# Patient Record
Sex: Female | Born: 1970 | Race: Black or African American | Hispanic: No | Marital: Married | Smoking: Never smoker
Health system: Southern US, Community
[De-identification: ages and names within clinical notes are randomized; demographics above are authoritative.]

## PROBLEM LIST (undated history)

## (undated) DIAGNOSIS — Z9981 Dependence on supplemental oxygen: Secondary | ICD-10-CM

## (undated) DIAGNOSIS — I2699 Other pulmonary embolism without acute cor pulmonale: Secondary | ICD-10-CM

## (undated) DIAGNOSIS — G629 Polyneuropathy, unspecified: Secondary | ICD-10-CM

## (undated) DIAGNOSIS — F419 Anxiety disorder, unspecified: Secondary | ICD-10-CM

## (undated) DIAGNOSIS — D649 Anemia, unspecified: Secondary | ICD-10-CM

## (undated) DIAGNOSIS — M545 Low back pain, unspecified: Secondary | ICD-10-CM

## (undated) DIAGNOSIS — E039 Hypothyroidism, unspecified: Secondary | ICD-10-CM

## (undated) DIAGNOSIS — Z992 Dependence on renal dialysis: Secondary | ICD-10-CM

## (undated) DIAGNOSIS — N186 End stage renal disease: Secondary | ICD-10-CM

## (undated) DIAGNOSIS — M1990 Unspecified osteoarthritis, unspecified site: Secondary | ICD-10-CM

## (undated) DIAGNOSIS — Z9289 Personal history of other medical treatment: Secondary | ICD-10-CM

## (undated) DIAGNOSIS — F32A Depression, unspecified: Secondary | ICD-10-CM

## (undated) DIAGNOSIS — I499 Cardiac arrhythmia, unspecified: Secondary | ICD-10-CM

## (undated) DIAGNOSIS — K5900 Constipation, unspecified: Secondary | ICD-10-CM

## (undated) DIAGNOSIS — G8929 Other chronic pain: Secondary | ICD-10-CM

## (undated) DIAGNOSIS — G2581 Restless legs syndrome: Secondary | ICD-10-CM

## (undated) DIAGNOSIS — E119 Type 2 diabetes mellitus without complications: Secondary | ICD-10-CM

## (undated) DIAGNOSIS — G4733 Obstructive sleep apnea (adult) (pediatric): Secondary | ICD-10-CM

## (undated) DIAGNOSIS — F329 Major depressive disorder, single episode, unspecified: Secondary | ICD-10-CM

## (undated) HISTORY — PX: DILATION AND CURETTAGE OF UTERUS: SHX78

## (undated) HISTORY — PX: ARTERIOVENOUS GRAFT PLACEMENT: SUR1029

## (undated) HISTORY — DX: Morbid (severe) obesity due to excess calories: E66.01

## (undated) HISTORY — PX: ESOPHAGOGASTRODUODENOSCOPY: SHX1529

## (undated) HISTORY — PX: THROMBECTOMY / ARTERIOVENOUS GRAFT REVISION: SUR1351

## (undated) HISTORY — PX: DEBRIDMENT OF DECUBITUS ULCER: SHX6276

## (undated) HISTORY — PX: INCISION AND DRAINAGE OF WOUND: SHX1803

---

## 2010-12-04 DIAGNOSIS — Z9289 Personal history of other medical treatment: Secondary | ICD-10-CM

## 2010-12-04 HISTORY — PX: BELOW KNEE LEG AMPUTATION: SUR23

## 2010-12-04 HISTORY — DX: Personal history of other medical treatment: Z92.89

## 2015-12-05 DIAGNOSIS — I2699 Other pulmonary embolism without acute cor pulmonale: Secondary | ICD-10-CM

## 2015-12-05 HISTORY — DX: Other pulmonary embolism without acute cor pulmonale: I26.99

## 2016-03-27 ENCOUNTER — Other Ambulatory Visit: Payer: Self-pay | Admitting: *Deleted

## 2016-03-27 DIAGNOSIS — N186 End stage renal disease: Secondary | ICD-10-CM

## 2016-03-27 DIAGNOSIS — Z01810 Encounter for preprocedural cardiovascular examination: Secondary | ICD-10-CM

## 2016-03-28 ENCOUNTER — Encounter (HOSPITAL_COMMUNITY): Payer: Self-pay

## 2016-03-28 ENCOUNTER — Ambulatory Visit: Payer: Self-pay | Admitting: Vascular Surgery

## 2016-03-28 ENCOUNTER — Other Ambulatory Visit (HOSPITAL_COMMUNITY): Payer: Self-pay

## 2016-04-06 ENCOUNTER — Encounter: Payer: Self-pay | Admitting: Vascular Surgery

## 2016-04-11 ENCOUNTER — Ambulatory Visit (HOSPITAL_COMMUNITY): Payer: Medicare Other

## 2016-04-11 ENCOUNTER — Ambulatory Visit: Payer: Medicare Other | Admitting: Vascular Surgery

## 2016-04-12 ENCOUNTER — Encounter: Payer: Self-pay | Admitting: Vascular Surgery

## 2016-04-12 ENCOUNTER — Ambulatory Visit (INDEPENDENT_AMBULATORY_CARE_PROVIDER_SITE_OTHER)
Admission: RE | Admit: 2016-04-12 | Discharge: 2016-04-12 | Disposition: A | Discharge status: Home / Self Care | Payer: Medicare Other | Source: Ambulatory Visit | Attending: Vascular Surgery | Admitting: Vascular Surgery

## 2016-04-12 ENCOUNTER — Encounter (HOSPITAL_COMMUNITY): Payer: Medicare Other

## 2016-04-12 ENCOUNTER — Ambulatory Visit: Payer: Medicare Other | Admitting: Vascular Surgery

## 2016-04-12 ENCOUNTER — Other Ambulatory Visit (HOSPITAL_COMMUNITY): Payer: Medicare Other

## 2016-04-12 ENCOUNTER — Ambulatory Visit (HOSPITAL_COMMUNITY)
Admission: RE | Admit: 2016-04-12 | Discharge: 2016-04-12 | Disposition: A | Discharge status: Home / Self Care | Payer: Medicare Other | Source: Ambulatory Visit | Attending: Vascular Surgery | Admitting: Vascular Surgery

## 2016-04-12 ENCOUNTER — Ambulatory Visit (INDEPENDENT_AMBULATORY_CARE_PROVIDER_SITE_OTHER): Payer: Medicare Other | Admitting: Vascular Surgery

## 2016-04-12 VITALS — BP 93/61 | HR 93 | Ht 72.0 in | Wt 312.0 lb

## 2016-04-12 DIAGNOSIS — Z01810 Encounter for preprocedural cardiovascular examination: Secondary | ICD-10-CM

## 2016-04-12 DIAGNOSIS — N186 End stage renal disease: Secondary | ICD-10-CM

## 2016-04-12 NOTE — Progress Notes (Signed)
[facility] [place]  Patient name: Christina Walters MRN: [id number] DOB: 1971/08/10 Sex: female  REASON FOR CONSULT: Evaluate for new access. Further by Dr. Fidela Salisbury.  HPI: Christina Walters is a 45 y.o. female, who comes in for evaluation for new hemodialysis access. She has had forearm and upper arm grafts on both sides. Most recently, she had a left HeRo raft done in [facility] in March 2017. This has occluded. She did have a previous left thigh AV graft which had to be removed because of infection. She's never had a right thigh AV graft. 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 She dialyzes on Monday Wednesdays and Fridays.  She denies any recent uremic symptoms. Specifically she denies nausea, vomiting, fatigue, anorexia, or palpitations.  No past medical history on file.  Her past medical history is significant for end-stage renal disease. She has diabetes, previous history of hypertension, history of neuropathy, hypothyroidism, and morbid obesity.  No family history on file.  SOCIAL HISTORY: Social History   Social History  . Marital Status: Married    Spouse Name: N/A  . Number of Children: N/A  . Years of Education: N/A   Occupational History  . Not on file.   Social History Main Topics  . Smoking status: Never Smoker   . Smokeless tobacco: Never Used  . Alcohol Use: No  . Drug Use: No  . Sexual Activity: Not on file   Other Topics Concern  . Not on file   Social History Narrative  . No narrative on file    Allergies  Allergen Reactions  . Penicillins Hives and Itching  . Sulfa Antibiotics Hives and Itching  . Sulfamethoxazole-Trimethoprim Hives and Itching    Current Outpatient Prescriptions  Medication Sig Dispense Refill  . aspirin 81 MG tablet Take 81 mg by mouth daily.    . cinacalcet (SENSIPAR) 30 MG tablet Take by mouth.    . cyclobenzaprine  (FLEXERIL) 10 MG tablet Take by mouth.    Tery Sanfilippo Sodium 100 MG capsule Take by mouth.    . DULoxetine (CYMBALTA) 20 MG capsule Take by mouth.    . gabapentin (NEURONTIN) 600 MG tablet Take by mouth.    . hydrOXYzine (ATARAX/VISTARIL) 50 MG tablet     . levothyroxine (SYNTHROID, LEVOTHROID) 88 MCG tablet Take by mouth.    . midodrine (PROAMATINE) 5 MG tablet Take by mouth.    . mirtazapine (REMERON) 7.5 MG tablet Take by mouth.    . nystatin (NYSTATIN) powder Apply topically.    . ondansetron (ZOFRAN) 8 MG tablet Take by mouth.    . sevelamer carbonate (RENVELA) 800 MG tablet Take by mouth.     No current facility-administered medications for this visit.    REVIEW OF SYSTEMS:  [X]  denotes positive finding, [ ]  denotes negative finding Cardiac  Comments:  Chest pain or chest pressure:    Shortness of breath upon exertion:    Short of breath when lying flat:    Irregular heart rhythm:        Vascular    Pain in calf, thigh, or hip brought on by ambulation:    Pain in feet at night that wakes you up from your sleep:     Blood clot in your veins:    Leg swelling:         Pulmonary    Oxygen at home: X   Productive cough:  Wheezing:  X       Neurologic    Sudden weakness in arms or legs:     Sudden numbness in arms or legs:     Sudden onset of difficulty speaking or slurred speech:    Temporary loss of vision in one eye:     Problems with dizziness:         Gastrointestinal    Blood in stool:     Vomited blood:         Genitourinary    Burning when urinating:     Blood in urine:        Psychiatric    Major depression:  X       Hematologic    Bleeding problems:    Problems with blood clotting too easily:        Skin    Rashes or ulcers:        Constitutional    Fever or chills:      PHYSICAL EXAM: Filed Vitals:   04/12/16 1611  BP: 93/61  Pulse: 93  Height: 6' (1.829 m)  Weight: 312 lb (141.522 kg)  SpO2: 96%    GENERAL: The patient is a  well-nourished female, in no acute distress. The vital signs are documented above. CARDIAC: There is a regular rate and rhythm.  VASCULAR: she has a nonfunctioning graft in her right upper arm, right forearm, left upper arm, left forearm. I cannot palpate pedal pulses on the right although she does have a biphasic dorsalis pedis signal on the right. I cannot obtain a posterior tibial signal on the right. PULMONARY: There is good air exchange bilaterally without wheezing or rales. ABDOMEN: Soft and non-tender with normal pitched bowel sounds.  MUSCULOSKELETAL: There are no major deformities or cyanosis. NEUROLOGIC: No focal weakness or paresthesias are detected. SKIN: There are no ulcers or rashes noted. PSYCHIATRIC: The patient has a normal affect. She has a functioning right IJ tunneled dialysis catheter.  DATA:   UPPER EXTREMITY ARTERIAL DUPLEX: I have independently interpreted her upper extremity arterial duplex.  On the right side, there is a triphasic brachial signal, biphasic radial signal, and biphasic ulnar signal.  On the left side there is a triphasic brachial signal with a monophasic radial and ulnar signal.  UPPER EXTREMITY VEIN MAP: I have independently interpreted his upper extremity vein map area I do not see a usable cephalic vein or basilic vein in either arm.  MEDICAL ISSUES:  END-STAGE RENAL DISEASE: This patient has had upper arm grafts and forearm grafts in both arms. Her vein map today shows that there are no options for fistula and either arm. The incision in her upper arm and the right is fairly low and if the vein is patent in the axilla she could potentially have a new right upper arm graft. This reason I scheduled a venogram. If her venogram shows that she does not have a central venous occlusion on the right I would recommend placement of a new right upper arm graft. We need to be sure that her blood pressure is good as she has had some problems with hypotension  and I would be reluctant to place a graft if she is continuing to have problems with hypotension. If she has a central venous occlusion on the right then she could be considered for a right thigh graft although I think it would be helpful to have a preoperative ABI given that she's had a previous left below the knee amputation and  I cannot obtain a posterior tibial signal with the Doppler. She does have a biphasic dorsalis pedis signal. I'll make further recommendations pending the results of her venogram.   Waverly Ferrariickson, Rigo Letts [facility] [place] Beeper: [phone]

## 2016-04-13 ENCOUNTER — Encounter: Payer: Self-pay | Admitting: Nephrology

## 2016-04-13 ENCOUNTER — Other Ambulatory Visit: Payer: Self-pay

## 2016-04-24 ENCOUNTER — Encounter (HOSPITAL_COMMUNITY): Admission: RE | Payer: Self-pay | Source: Ambulatory Visit

## 2016-04-24 ENCOUNTER — Ambulatory Visit (HOSPITAL_COMMUNITY): Admission: RE | Admit: 2016-04-24 | Payer: Medicare Other | Source: Ambulatory Visit | Admitting: Vascular Surgery

## 2016-04-24 SURGERY — Surgical Case
Anesthesia: *Unknown

## 2016-04-24 SURGERY — A/V SHUNTOGRAM/FISTULAGRAM

## 2016-04-28 ENCOUNTER — Telehealth: Payer: Self-pay

## 2016-04-28 ENCOUNTER — Other Ambulatory Visit: Payer: Self-pay

## 2016-04-28 NOTE — Telephone Encounter (Signed)
Erroneous encounter

## 2016-05-09 ENCOUNTER — Encounter (HOSPITAL_BASED_OUTPATIENT_CLINIC_OR_DEPARTMENT_OTHER): Payer: Self-pay | Admitting: Emergency Medicine

## 2016-05-09 ENCOUNTER — Non-Acute Institutional Stay (HOSPITAL_BASED_OUTPATIENT_CLINIC_OR_DEPARTMENT_OTHER)
Admission: EM | Admit: 2016-05-09 | Discharge: 2016-05-10 | Disposition: A | Discharge status: Home / Self Care | Payer: Medicare Other | Attending: Emergency Medicine | Admitting: Emergency Medicine

## 2016-05-09 DIAGNOSIS — E875 Hyperkalemia: Secondary | ICD-10-CM | POA: Insufficient documentation

## 2016-05-09 DIAGNOSIS — Z79899 Other long term (current) drug therapy: Secondary | ICD-10-CM | POA: Diagnosis not present

## 2016-05-09 DIAGNOSIS — Z7982 Long term (current) use of aspirin: Secondary | ICD-10-CM | POA: Insufficient documentation

## 2016-05-09 DIAGNOSIS — Z9115 Patient's noncompliance with renal dialysis: Secondary | ICD-10-CM | POA: Insufficient documentation

## 2016-05-09 DIAGNOSIS — Z992 Dependence on renal dialysis: Secondary | ICD-10-CM | POA: Insufficient documentation

## 2016-05-09 DIAGNOSIS — J029 Acute pharyngitis, unspecified: Secondary | ICD-10-CM | POA: Diagnosis present

## 2016-05-09 DIAGNOSIS — E1165 Type 2 diabetes mellitus with hyperglycemia: Secondary | ICD-10-CM | POA: Diagnosis not present

## 2016-05-09 DIAGNOSIS — Z89512 Acquired absence of left leg below knee: Secondary | ICD-10-CM | POA: Insufficient documentation

## 2016-05-09 LAB — BASIC METABOLIC PANEL
Anion gap: 20 — ABNORMAL HIGH (ref 5–15)
BUN: 91 mg/dL — AB (ref 6–20)
CHLORIDE: 91 mmol/L — AB (ref 101–111)
CO2: 20 mmol/L — ABNORMAL LOW (ref 22–32)
CREATININE: 17.84 mg/dL — AB (ref 0.44–1.00)
Calcium: 9.3 mg/dL (ref 8.9–10.3)
GFR calc Af Amer: 2 mL/min — ABNORMAL LOW (ref >60)
GFR calc non Af Amer: 2 mL/min — ABNORMAL LOW (ref >60)
GLUCOSE: 154 mg/dL — AB (ref 65–99)
Potassium: >7.5 mmol/L (ref 3.5–5.1)
SODIUM: 131 mmol/L — AB (ref 135–145)

## 2016-05-09 LAB — CBC WITH DIFFERENTIAL/PLATELET
BASOS ABS: 0 10*3/uL (ref 0.0–0.1)
Basophils Relative: 0 %
Eosinophils Absolute: 0.2 10*3/uL (ref 0.0–0.7)
Eosinophils Relative: 2 %
HCT: 41.6 % (ref 36.0–46.0)
HEMOGLOBIN: 13.6 g/dL (ref 12.0–15.0)
LYMPHS ABS: 1.1 10*3/uL (ref 0.7–4.0)
Lymphocytes Relative: 10 %
MCH: 29.1 pg (ref 26.0–34.0)
MCHC: 32.7 g/dL (ref 30.0–36.0)
MCV: 89.1 fL (ref 78.0–100.0)
MONO ABS: 1.4 10*3/uL — AB (ref 0.1–1.0)
Monocytes Relative: 13 %
Neutro Abs: 7.9 10*3/uL — ABNORMAL HIGH (ref 1.7–7.7)
Neutrophils Relative %: 75 %
PLATELETS: 223 10*3/uL (ref 150–400)
RBC: 4.67 MIL/uL (ref 3.87–5.11)
RDW: 18.7 % — ABNORMAL HIGH (ref 11.5–15.5)
WBC: 10.6 10*3/uL — AB (ref 4.0–10.5)

## 2016-05-09 LAB — RAPID STREP SCREEN (MED CTR MEBANE ONLY): STREPTOCOCCUS, GROUP A SCREEN (DIRECT): NEGATIVE

## 2016-05-09 LAB — TROPONIN I: Troponin I: 0.03 ng/mL (ref <0.031)

## 2016-05-09 MED ORDER — DEXTROSE 50 % IV SOLN
1.0000 | Freq: Once | INTRAVENOUS | Status: AC
  Administered 2016-05-09: 50 mL via INTRAVENOUS
  Filled 2016-05-09: qty 50

## 2016-05-09 MED ORDER — INSULIN ASPART 100 UNIT/ML IV SOLN
10.0000 [IU] | Freq: Once | INTRAVENOUS | Status: AC
  Administered 2016-05-09: 10 [IU] via INTRAVENOUS
  Filled 2016-05-09: qty 1

## 2016-05-09 MED ORDER — ALTEPLASE 2 MG IJ SOLR
2.0000 mg | Freq: Once | INTRAMUSCULAR | Status: AC | PRN
  Administered 2016-05-10: 2 mg

## 2016-05-09 MED ORDER — SODIUM CHLORIDE 0.9 % IV SOLN
1.0000 g | Freq: Once | INTRAVENOUS | Status: DC
  Filled 2016-05-09: qty 10

## 2016-05-09 MED ORDER — LIDOCAINE HCL (PF) 1 % IJ SOLN
INTRAMUSCULAR | Status: AC
  Administered 2016-05-09: 5 mL
  Filled 2016-05-09: qty 5

## 2016-05-09 NOTE — ED Notes (Signed)
Report given to Craig with [facility] 

## 2016-05-09 NOTE — Procedures (Signed)
45 year old BF on dialysis for 5 years but just transferred to [facility] in early April.  Patient presented to the [facility] just not feeling right- sore throat and fatigue.  In looking at OP HD records, pt has not been staying full time of 5 hours- has had kt/v of mostly less than 1.0- had a new PC placed on Friday- only had 1:30 min on Friday and missed Monday- K's at OP center have been 5.8.  Today K was greater than 7.5 so brought here for emergent HD after calcium, insulin and d50 given.  Will do HD on zero K bath followed by 1 K in order to get K down enough so that she can be discharged but will need to [facility] (Wednesday) for her full treatment.  She understands this plan- husband is at bedside as well   [name]

## 2016-05-09 NOTE — ED Notes (Signed)
Pt is very difficult iv start 2 unsuccessful attempts this writer #1 L hand and #2 L forearm EDP aware second nurse and Resp tech attempting to obtain IV site at this time.

## 2016-05-09 NOTE — ED Notes (Signed)
Onset of body aches  sorethroat

## 2016-05-09 NOTE — ED Notes (Signed)
Missed dialysis yesterday , didn't feel well

## 2016-05-09 NOTE — ED Provider Notes (Addendum)
CSN: [id number]     Arrival date & time 05/09/16  1658 History   First MD Initiated Contact with Patient 05/09/16 1713     Chief Complaint  Patient presents with  . Sore Throat     (Consider location/radiation/quality/duration/timing/severity/associated sxs/prior Treatment) HPI Complains of sore throat and pain with swallowing onset 3 days ago. Symptoms, he by generalized weakness. She states she was short of breath 3 days ago however breathing is normal today. She denies any chest pain has been treating herself with Tylenol with partial relief. She denies any fever. Denies shortness of breath. Denies abdominal pain or chest pain. Last hemodialysis session was 4 days ago. She missed her session yesterday she didn't feel well. She does have dialysis scheduled for tomorrow at 10:30 AM Past Medical History  Diagnosis Date  . Diabetes mellitus without complication (HCC)   . Dialysis patient [facility])   Hypotension Past Surgical History  Procedure Laterality Date  . Below knee leg amputation    . Orthopedic surgery     No family history on file. Social History  Substance Use Topics  . Smoking status: Never Smoker   . Smokeless tobacco: Never Used  . Alcohol Use: No   OB History    No data available     Review of Systems  HENT: Positive for sore throat.   Skin: Positive for wound.       Chronic wound at left thigh. Left BKA  Allergic/Immunologic: Positive for immunocompromised state.       Diabetic  Neurological: Positive for weakness.  All other systems reviewed and are negative.     Allergies  Penicillins; Sulfa antibiotics; and Sulfamethoxazole-trimethoprim  Home Medications   Prior to Admission medications   Medication Sig Start Date End Date Taking? Authorizing Provider  aspirin 81 MG tablet Take 81 mg by mouth daily.    Historical Provider, MD  cinacalcet (SENSIPAR) 30 MG tablet Take 30 mg by mouth daily with breakfast.     Historical Provider, MD  cyclobenzaprine  (FLEXERIL) 10 MG tablet Take 10 mg by mouth 3 (three) times daily as needed for muscle spasms.  03/29/15   Historical Provider, MD  Docusate Sodium 100 MG capsule Take 100 mg by mouth 2 (two) times daily as needed for constipation.  10/30/12   Historical Provider, MD  DULoxetine (CYMBALTA) 20 MG capsule Take 20 mg by mouth daily.     Historical Provider, MD  gabapentin (NEURONTIN) 600 MG tablet Take 300 mg by mouth 3 (three) times daily.  04/16/15   Historical Provider, MD  hydrOXYzine (ATARAX/VISTARIL) 50 MG tablet Take 50 mg by mouth 3 (three) times daily as needed for itching.  11/03/15   Historical Provider, MD  levothyroxine (SYNTHROID, LEVOTHROID) 88 MCG tablet Take 88 mcg by mouth daily before breakfast.  08/27/13   Historical Provider, MD  midodrine (PROAMATINE) 5 MG tablet Take 5 mg by mouth 2 (two) times daily with a meal.     Historical Provider, MD  mirtazapine (REMERON) 7.5 MG tablet Take 7.5 mg by mouth at bedtime.  04/28/14   Historical Provider, MD  nystatin (NYSTATIN) powder Apply topically 2 (two) times daily.  03/30/15   Historical Provider, MD  ondansetron (ZOFRAN) 8 MG tablet Take 8 mg by mouth every 8 (eight) hours as needed for nausea.  03/05/14   Historical Provider, MD  sevelamer carbonate (RENVELA) 800 MG tablet Take 800 mg by mouth 3 (three) times daily with meals.     Historical Provider, MD  traZODone (DESYREL) 50 MG tablet Take 50 mg by mouth at bedtime as needed for sleep.    Historical Provider, MD   BP 108/73 mmHg  Pulse 80  Temp(Src) 97.9 F (36.6 C) (Oral)  Resp 20  Ht 6' (1.829 m)  Wt 298 lb (135.172 kg)  BMI 40.41 kg/m2  SpO2 95% Physical Exam  Constitutional: She is oriented to person, place, and time. She appears well-developed and well-nourished. No distress.  HENT:  Head: Normocephalic and atraumatic.  Right Ear: External ear normal.  Left Ear: External ear normal.  Bilateral tympanic membranes normal. Pharynx erythematous. Uvula midline. No exudate   Eyes: Conjunctivae are normal. Pupils are equal, round, and reactive to light.  Neck: Neck supple. No tracheal deviation present. No thyromegaly present.  Cardiovascular: Normal rate and regular rhythm.   No murmur heard. Pulmonary/Chest: Effort normal and breath sounds normal.  Abdominal: Soft. Bowel sounds are normal. She exhibits no distension. There is no tenderness.  Morbidly obese  Musculoskeletal: Normal range of motion. She exhibits no edema or tenderness.  Left lower extremity with BKA. Stump is clean. There is a 1 cm x 2 cm clean appearing wound at the lateral aspect distal thigh. No surrounding erythema. Nontender  Lymphadenopathy:    She has cervical adenopathy.  Neurological: She is alert and oriented to person, place, and time. Coordination normal.  Skin: Skin is warm and dry. No rash noted.  Psychiatric: She has a normal mood and affect.  Nursing note and vitals reviewed.   ED Course  Procedures (including critical care time) Labs Review Labs Reviewed - No data to display  Imaging Review No results found. I have personally reviewed and evaluated these images and lab results as part of my medical decision-making.   EKG Interpretation   Date/Time:  Tuesday May 09 2016 18:00:33 EDT Ventricular Rate:  76 PR Interval:  179 QRS Duration: 146 QT Interval:  371 QTC Calculation: 417 R Axis:   104 Text Interpretation:  Right and left arm electrode reversal,  interpretation assumes no reversal Sinus rhythm Nonspecific  intraventricular conduction delay Borderline abnrm T, anterolateral leads  Baseline wander in lead(s) V2 V6 No old tracing to compare Confirmed by  Ethelda ChickJACUBOWITZ  MD, Ivet Guerrieri [phone]([id number]) on 05/09/2016 6:13:18 PM     6:35 PM patient remains alert and appropriate Glasgow Coma Score 15 Results for orders placed or performed during the hospital encounter of 05/09/16  Rapid strep screen  Result Value Ref Range   Streptococcus, Group A Screen (Direct) NEGATIVE  NEGATIVE  Troponin I  Result Value Ref Range   Troponin I 0.03 <0.031 ng/mL  CBC with Differential/Platelet  Result Value Ref Range   WBC 10.6 (H) 4.0 - 10.5 K/uL   RBC 4.67 3.87 - 5.11 MIL/uL   Hemoglobin 13.6 12.0 - 15.0 g/dL   HCT 60.441.6 54.036.0 - 98.146.0 %   MCV 89.1 78.0 - 100.0 fL   MCH 29.1 26.0 - 34.0 pg   MCHC 32.7 30.0 - 36.0 g/dL   RDW 19.118.7 (H) 47.811.5 - 29.515.5 %   Platelets 223 150 - 400 K/uL   Neutrophils Relative % 75 %   Lymphocytes Relative 10 %   Monocytes Relative 13 %   Eosinophils Relative 2 %   Basophils Relative 0 %   Neutro Abs 7.9 (H) 1.7 - 7.7 K/uL   Lymphs Abs 1.1 0.7 - 4.0 K/uL   Monocytes Absolute 1.4 (H) 0.1 - 1.0 K/uL   Eosinophils Absolute 0.2 0.0 - 0.7  K/uL   Basophils Absolute 0.0 0.0 - 0.1 K/uL   RBC Morphology POLYCHROMASIA PRESENT    WBC Morphology TOXIC GRANULATION    Smear Review LARGE PLATELETS PRESENT   Basic metabolic panel  Result Value Ref Range   Sodium 131 (L) 135 - 145 mmol/L   Potassium >7.5 (HH) 3.5 - 5.1 mmol/L   Chloride 91 (L) 101 - 111 mmol/L   CO2 20 (L) 22 - 32 mmol/L   Glucose, Bld 154 (H) 65 - 99 mg/dL   BUN 91 (H) 6 - 20 mg/dL   Creatinine, Ser 16.10 (H) 0.44 - 1.00 mg/dL   Calcium 9.3 8.9 - 96.0 mg/dL   GFR calc non Af Amer 2 (L) >60 mL/min   GFR calc Af Amer 2 (L) >60 mL/min   Anion gap 20 (H) 5 - 15   No results found.  MDM  Patient noted to be markedly hyperkalemic. Intravenous D50, insulin, calcium gluconate ordered. Dr. Kathrene Bongo consulted for emergent hemodialysis. Patient will be transported to [facility] emergency department. Dr. Preston Fleeting aware of transfer. Market hyperkalemia likely secondary to noncompliance with hemodialysis Diagnosis #1 hyperkalemia #2 pharyngitis #3 weakness #4 noncompliance with hemodialysis #% hyperglycemia Final diagnoses:  None    CRITICAL CARE Performed by: Doug Sou Total critical care time: 30         minutes Critical care time was exclusive of separately billable  procedures and treating other patients. Critical care was necessary to treat or prevent imminent or life-threatening deterioration. Critical care was time spent personally by me on the following activities: development of treatment plan with patient and/or surrogate as well as nursing, discussions with consultants, evaluation of patient's response to treatment, examination of patient, obtaining history from patient or surrogate, ordering and performing treatments and interventions, ordering and review of laboratory studies, ordering and review of radiographic studies, pulse oximetry and re-evaluation of patient's condition.    Doug Sou, MD 05/09/16 1941 Addendum nursing could not obtain IV access. I attempted peripheral IV access at left sided external jugular without success. Nursing obtained IV access through dialysis catheter right subclavian area, in order to administer medications ordered. Dr. Kathrene Bongo was contacted and is in agreement  Doug Sou, MD 05/09/16 2020

## 2016-05-10 LAB — POTASSIUM: Potassium: 5.4 mmol/L — ABNORMAL HIGH (ref 3.5–5.1)

## 2016-05-10 MED ORDER — HEPARIN SODIUM (PORCINE) 1000 UNIT/ML DIALYSIS: 1000.0000 [IU] | INTRAMUSCULAR | Status: DC | PRN

## 2016-05-10 MED ORDER — HEPARIN SODIUM (PORCINE) 1000 UNIT/ML DIALYSIS: 20.0000 [IU]/kg | INTRAMUSCULAR | Status: DC | PRN

## 2016-05-10 MED ORDER — SODIUM CHLORIDE 0.9 % IV SOLN: 100.0000 mL | INTRAVENOUS | Status: DC | PRN

## 2016-05-10 MED ORDER — LIDOCAINE HCL (PF) 1 % IJ SOLN: 5.0000 mL | INTRAMUSCULAR | Status: DC | PRN

## 2016-05-10 MED ORDER — ALTEPLASE 2 MG IJ SOLR
INTRAMUSCULAR | Status: AC
  Administered 2016-05-10: 2 mg
  Filled 2016-05-10: qty 4

## 2016-05-10 MED ORDER — LIDOCAINE-PRILOCAINE 2.5-2.5 % EX CREA: 1.0000 "application " | TOPICAL_CREAM | CUTANEOUS | Status: DC | PRN

## 2016-05-10 MED ORDER — ACETAMINOPHEN 325 MG PO TABS
650.0000 mg | ORAL_TABLET | Freq: Once | ORAL | Status: AC
  Administered 2016-05-10: 650 mg via ORAL

## 2016-05-10 MED ORDER — ACETAMINOPHEN 325 MG PO TABS
ORAL_TABLET | ORAL | Status: AC
  Administered 2016-05-10: 650 mg via ORAL
  Filled 2016-05-10: qty 2

## 2016-05-10 MED ORDER — PENTAFLUOROPROP-TETRAFLUOROETH EX AERO: 1.0000 "application " | INHALATION_SPRAY | CUTANEOUS | Status: DC | PRN

## 2016-05-10 MED ORDER — ALTEPLASE 2 MG IJ SOLR
4.0000 mg | Freq: Once | INTRAMUSCULAR | Status: AC
  Administered 2016-05-10: 4 mg

## 2016-05-10 MED ORDER — ALTEPLASE 2 MG IJ SOLR
2.0000 mg | Freq: Once | INTRAMUSCULAR | Status: AC | PRN
Start: 2016-05-10 — End: 2016-05-10
  Administered 2016-05-10: 2 mg

## 2016-05-10 MED ORDER — ALTEPLASE 2 MG IJ SOLR
INTRAMUSCULAR | Status: AC
  Administered 2016-05-10: 4 mg
  Filled 2016-05-10: qty 4

## 2016-05-12 ENCOUNTER — Encounter (HOSPITAL_COMMUNITY): Payer: Self-pay | Admitting: *Deleted

## 2016-05-12 ENCOUNTER — Emergency Department (HOSPITAL_COMMUNITY)
Admission: EM | Admit: 2016-05-12 | Discharge: 2016-05-12 | Disposition: A | Discharge status: Home / Self Care | Payer: Medicare Other | Source: Home / Self Care | Attending: Emergency Medicine | Admitting: Emergency Medicine

## 2016-05-12 DIAGNOSIS — Z7982 Long term (current) use of aspirin: Secondary | ICD-10-CM

## 2016-05-12 DIAGNOSIS — E119 Type 2 diabetes mellitus without complications: Secondary | ICD-10-CM | POA: Insufficient documentation

## 2016-05-12 DIAGNOSIS — N189 Chronic kidney disease, unspecified: Secondary | ICD-10-CM | POA: Insufficient documentation

## 2016-05-12 DIAGNOSIS — I2699 Other pulmonary embolism without acute cor pulmonale: Secondary | ICD-10-CM | POA: Diagnosis present

## 2016-05-12 DIAGNOSIS — Y731 Therapeutic (nonsurgical) and rehabilitative gastroenterology and urology devices associated with adverse incidents: Secondary | ICD-10-CM | POA: Insufficient documentation

## 2016-05-12 DIAGNOSIS — E875 Hyperkalemia: Secondary | ICD-10-CM | POA: Diagnosis not present

## 2016-05-12 DIAGNOSIS — Z992 Dependence on renal dialysis: Secondary | ICD-10-CM

## 2016-05-12 DIAGNOSIS — Y829 Unspecified medical devices associated with adverse incidents: Secondary | ICD-10-CM | POA: Diagnosis present

## 2016-05-12 DIAGNOSIS — T8249XA Other complication of vascular dialysis catheter, initial encounter: Secondary | ICD-10-CM | POA: Insufficient documentation

## 2016-05-12 DIAGNOSIS — D631 Anemia in chronic kidney disease: Secondary | ICD-10-CM | POA: Diagnosis present

## 2016-05-12 DIAGNOSIS — N186 End stage renal disease: Secondary | ICD-10-CM | POA: Diagnosis present

## 2016-05-12 DIAGNOSIS — T82868A Thrombosis of vascular prosthetic devices, implants and grafts, initial encounter: Secondary | ICD-10-CM | POA: Diagnosis not present

## 2016-05-12 DIAGNOSIS — I9589 Other hypotension: Secondary | ICD-10-CM | POA: Diagnosis present

## 2016-05-12 DIAGNOSIS — Z89611 Acquired absence of right leg above knee: Secondary | ICD-10-CM

## 2016-05-12 DIAGNOSIS — F329 Major depressive disorder, single episode, unspecified: Secondary | ICD-10-CM | POA: Diagnosis present

## 2016-05-12 DIAGNOSIS — I871 Compression of vein: Secondary | ICD-10-CM | POA: Diagnosis present

## 2016-05-12 DIAGNOSIS — N2581 Secondary hyperparathyroidism of renal origin: Secondary | ICD-10-CM | POA: Diagnosis present

## 2016-05-12 DIAGNOSIS — I82210 Acute embolism and thrombosis of superior vena cava: Secondary | ICD-10-CM | POA: Diagnosis present

## 2016-05-12 DIAGNOSIS — E039 Hypothyroidism, unspecified: Secondary | ICD-10-CM | POA: Diagnosis present

## 2016-05-12 DIAGNOSIS — F419 Anxiety disorder, unspecified: Secondary | ICD-10-CM | POA: Diagnosis present

## 2016-05-12 DIAGNOSIS — I82890 Acute embolism and thrombosis of other specified veins: Secondary | ICD-10-CM | POA: Diagnosis present

## 2016-05-12 DIAGNOSIS — Z89512 Acquired absence of left leg below knee: Secondary | ICD-10-CM

## 2016-05-12 DIAGNOSIS — E1122 Type 2 diabetes mellitus with diabetic chronic kidney disease: Secondary | ICD-10-CM | POA: Diagnosis present

## 2016-05-12 DIAGNOSIS — Z6841 Body Mass Index (BMI) 40.0 and over, adult: Secondary | ICD-10-CM

## 2016-05-12 DIAGNOSIS — G894 Chronic pain syndrome: Secondary | ICD-10-CM | POA: Diagnosis present

## 2016-05-12 LAB — BASIC METABOLIC PANEL
ANION GAP: 14 (ref 5–15)
BUN: 60 mg/dL — ABNORMAL HIGH (ref 6–20)
CO2: 25 mmol/L (ref 22–32)
Calcium: 8.7 mg/dL — ABNORMAL LOW (ref 8.9–10.3)
Chloride: 96 mmol/L — ABNORMAL LOW (ref 101–111)
Creatinine, Ser: 13.83 mg/dL — ABNORMAL HIGH (ref 0.44–1.00)
GFR, EST AFRICAN AMERICAN: 3 mL/min — AB (ref >60)
GFR, EST NON AFRICAN AMERICAN: 3 mL/min — AB (ref >60)
Glucose, Bld: 112 mg/dL — ABNORMAL HIGH (ref 65–99)
POTASSIUM: 5.2 mmol/L — AB (ref 3.5–5.1)
SODIUM: 135 mmol/L (ref 135–145)

## 2016-05-12 LAB — CULTURE, GROUP A STREP (THRC)

## 2016-05-12 NOTE — Discharge Instructions (Signed)
Chronic Kidney Disease Chronic kidney disease happens when the kidneys are damaged over a long period. The kidneys are two organs that do many important jobs in the body. These jobs include:  Removing wastes and extra fluids from the blood.  Making hormones that help to keep the body healthy.  Making sure that the body has the right amount of fluids and chemicals. Chronic kidney disease may be caused by many things. The kidney damage occurs slowly. If too much damage occurs, the kidneys may stop working the way that they should. This is dangerous. Treatment can help to slow down the damage and keep it from getting worse. HOME CARE  Follow your diet as told by your doctor. You may need to limit the amount of salt (sodium) and protein that you eat each day.  Take medicines only as told by your doctor. Do not take any new medicines unless your doctor approves it.  Quit smoking if you smoke. Talk to your doctor about programs that may help you quit smoking.  Have your blood pressure checked regularly and keep track of the results.  Start or keep doing an exercise plan.  Get shots (immunizations) as told by your doctor.  Take vitamins and minerals as told by your doctor.  Keep all follow-up visits as told by your doctor. This is important. GET HELP RIGHT AWAY IF:   Your symptoms get worse.  You have new symptoms.  You have symptoms of end-stage kidney disease. These include:  Headaches.  Skin that is darker or lighter than normal.  Numbness in the hands or feet.  Easy bruising.  Frequent hiccups.  Stopping of menstrual periods in women.  You have a fever.  You are making very little pee (urine).  You have pain or bleeding when you pee.   This information is not intended to replace advice given to you by your health care provider. Make sure you discuss any questions you have with your health care provider.   Document Released: 02/14/2010 Document Revised: 08/11/2015  Document Reviewed: 07/19/2012 Elsevier Interactive Patient Education 2016 [employer]  

## 2016-05-12 NOTE — ED Notes (Signed)
Difficulty finding usable blood draw site. Waiting for IV team to assess port.

## 2016-05-12 NOTE — ED Notes (Signed)
Pt here due to hemodialysis catheter clogged/unable to flush. Pt states she was unable to receive dialysis today. Pt had catheter flushed with clot buster and was still unable to flush.

## 2016-05-12 NOTE — ED Provider Notes (Signed)
CSN: [id number]     Arrival date & time 05/12/16  1355 History   First MD Initiated Contact with Patient 05/12/16 1421     Chief Complaint  Patient presents with  . dialysis catheter obstruction      HPI  Expand All Collapse All   Pt here due to hemodialysis catheter clogged/unable to flush. Pt states she was unable to receive dialysis today. Pt had catheter flushed with clot buster and was still unable to flush.         Past Medical History  Diagnosis Date  . Diabetes mellitus without complication (HCC)   . Dialysis patient [facility])    Past Surgical History  Procedure Laterality Date  . Below knee leg amputation    . Orthopedic surgery     No family history on file. Social History  Substance Use Topics  . Smoking status: Never Smoker   . Smokeless tobacco: Never Used  . Alcohol Use: No   OB History    No data available     Review of Systems  All other systems reviewed and are negative.     Allergies  Penicillins; Sulfa antibiotics; and Sulfamethoxazole-trimethoprim  Home Medications   Prior to Admission medications   Medication Sig Start Date End Date Taking? Authorizing Provider  aspirin 81 MG tablet Take 81 mg by mouth daily.   Yes Historical Provider, MD  cinacalcet (SENSIPAR) 30 MG tablet Take 30 mg by mouth daily with breakfast.    Yes Historical Provider, MD  cyclobenzaprine (FLEXERIL) 10 MG tablet Take 10 mg by mouth 3 (three) times daily as needed for muscle spasms.  03/29/15  Yes Historical Provider, MD  Docusate Sodium 100 MG capsule Take 100 mg by mouth 2 (two) times daily as needed for constipation.  10/30/12  Yes Historical Provider, MD  DULoxetine (CYMBALTA) 30 MG capsule Take 30 mg by mouth daily. 05/07/16  Yes Historical Provider, MD  DULoxetine (CYMBALTA) 60 MG capsule Take 60 mg by mouth daily. 05/07/16  Yes Historical Provider, MD  gabapentin (NEURONTIN) 600 MG tablet Take 300 mg by mouth 3 (three) times daily.  04/16/15  Yes Historical Provider, MD   hydrOXYzine (ATARAX/VISTARIL) 50 MG tablet Take 50 mg by mouth 3 (three) times daily as needed for itching.  11/03/15  Yes Historical Provider, MD  levothyroxine (SYNTHROID, LEVOTHROID) 88 MCG tablet Take 88 mcg by mouth daily before breakfast.  08/27/13  Yes Historical Provider, MD  midodrine (PROAMATINE) 5 MG tablet Take 5 mg by mouth 2 (two) times daily with a meal.    Yes Historical Provider, MD  mirtazapine (REMERON) 7.5 MG tablet Take 7.5 mg by mouth at bedtime.  04/28/14  Yes Historical Provider, MD  nystatin (NYSTATIN) powder Apply topically 2 (two) times daily.  03/30/15  Yes Historical Provider, MD  ondansetron (ZOFRAN) 8 MG tablet Take 8 mg by mouth every 8 (eight) hours as needed for nausea.  03/05/14  Yes Historical Provider, MD  sevelamer carbonate (RENVELA) 800 MG tablet Take 800 mg by mouth 3 (three) times daily with meals.    Yes Historical Provider, MD  traZODone (DESYREL) 50 MG tablet Take 50 mg by mouth at bedtime as needed for sleep.   Yes Historical Provider, MD   BP 87/55 mmHg  Pulse 90  Temp(Src) 98 F (36.7 C) (Oral)  Resp 18  Ht 6' (1.829 m)  Wt 298 lb (135.172 kg)  BMI 40.41 kg/m2  SpO2 100% Physical Exam  Constitutional: She is oriented to person,  place, and time. She appears well-developed and well-nourished. No distress.  HENT:  Head: Normocephalic and atraumatic.  Eyes: Pupils are equal, round, and reactive to light.  Neck: Normal range of motion.  Cardiovascular: Normal rate and intact distal pulses.   Pulmonary/Chest: No respiratory distress.  Abdominal: Normal appearance. She exhibits no distension.  Musculoskeletal: Normal range of motion.  Neurological: She is alert and oriented to person, place, and time. No cranial nerve deficit.  Skin: Skin is warm and dry. No rash noted.  Psychiatric: She has a normal mood and affect. Her behavior is normal.  Nursing note and vitals reviewed.   ED Course  Procedures (including critical care time) Labs  Review Labs Reviewed  BASIC METABOLIC PANEL - Abnormal; Notable for the following:    Potassium 5.2 (*)    Chloride 96 (*)    Glucose, Bld 112 (*)    BUN 60 (*)    Creatinine, Ser 13.83 (*)    Calcium 8.7 (*)    GFR calc non Af Amer 3 (*)    GFR calc Af Amer 3 (*)    All other components within normal limits        MDM   Final diagnoses:  CRF (chronic renal failure), unspecified stage        Nelva Nayobert Emet Rafanan, MD 05/12/16 1644

## 2016-05-15 ENCOUNTER — Encounter (HOSPITAL_COMMUNITY): Payer: Self-pay | Admitting: Vascular Surgery

## 2016-05-15 ENCOUNTER — Ambulatory Visit (HOSPITAL_COMMUNITY): Admission: RE | Admit: 2016-05-15 | Payer: Medicare Other | Source: Ambulatory Visit | Admitting: Vascular Surgery

## 2016-05-15 ENCOUNTER — Encounter (HOSPITAL_COMMUNITY): Admission: RE | Payer: Self-pay | Source: Ambulatory Visit

## 2016-05-15 ENCOUNTER — Other Ambulatory Visit: Payer: Self-pay

## 2016-05-15 ENCOUNTER — Emergency Department (HOSPITAL_COMMUNITY): Payer: Medicare Other

## 2016-05-15 ENCOUNTER — Inpatient Hospital Stay (HOSPITAL_COMMUNITY)
Admission: EM | Admit: 2016-05-15 | Discharge: 2016-05-22 | DRG: 314 | Disposition: A | Discharge status: Home / Self Care | Payer: Medicare Other | Attending: Internal Medicine | Admitting: Internal Medicine

## 2016-05-15 DIAGNOSIS — D631 Anemia in chronic kidney disease: Secondary | ICD-10-CM | POA: Diagnosis present

## 2016-05-15 DIAGNOSIS — E875 Hyperkalemia: Secondary | ICD-10-CM

## 2016-05-15 DIAGNOSIS — I953 Hypotension of hemodialysis: Secondary | ICD-10-CM | POA: Diagnosis not present

## 2016-05-15 DIAGNOSIS — Z89512 Acquired absence of left leg below knee: Secondary | ICD-10-CM | POA: Diagnosis not present

## 2016-05-15 DIAGNOSIS — I2699 Other pulmonary embolism without acute cor pulmonale: Secondary | ICD-10-CM | POA: Diagnosis present

## 2016-05-15 DIAGNOSIS — E1122 Type 2 diabetes mellitus with diabetic chronic kidney disease: Secondary | ICD-10-CM | POA: Diagnosis present

## 2016-05-15 DIAGNOSIS — F329 Major depressive disorder, single episode, unspecified: Secondary | ICD-10-CM | POA: Diagnosis present

## 2016-05-15 DIAGNOSIS — I82210 Acute embolism and thrombosis of superior vena cava: Secondary | ICD-10-CM | POA: Diagnosis present

## 2016-05-15 DIAGNOSIS — I82409 Acute embolism and thrombosis of unspecified deep veins of unspecified lower extremity: Secondary | ICD-10-CM

## 2016-05-15 DIAGNOSIS — Z89611 Acquired absence of right leg above knee: Secondary | ICD-10-CM | POA: Diagnosis not present

## 2016-05-15 DIAGNOSIS — T82868A Thrombosis of vascular prosthetic devices, implants and grafts, initial encounter: Secondary | ICD-10-CM

## 2016-05-15 DIAGNOSIS — Y829 Unspecified medical devices associated with adverse incidents: Secondary | ICD-10-CM | POA: Diagnosis present

## 2016-05-15 DIAGNOSIS — E039 Hypothyroidism, unspecified: Secondary | ICD-10-CM | POA: Diagnosis present

## 2016-05-15 DIAGNOSIS — I8290 Acute embolism and thrombosis of unspecified vein: Secondary | ICD-10-CM

## 2016-05-15 DIAGNOSIS — I82890 Acute embolism and thrombosis of other specified veins: Secondary | ICD-10-CM | POA: Diagnosis present

## 2016-05-15 DIAGNOSIS — I871 Compression of vein: Secondary | ICD-10-CM | POA: Diagnosis present

## 2016-05-15 DIAGNOSIS — N186 End stage renal disease: Secondary | ICD-10-CM

## 2016-05-15 DIAGNOSIS — F419 Anxiety disorder, unspecified: Secondary | ICD-10-CM | POA: Diagnosis present

## 2016-05-15 DIAGNOSIS — Z992 Dependence on renal dialysis: Secondary | ICD-10-CM | POA: Diagnosis not present

## 2016-05-15 DIAGNOSIS — T82868S Thrombosis of vascular prosthetic devices, implants and grafts, sequela: Secondary | ICD-10-CM | POA: Diagnosis not present

## 2016-05-15 DIAGNOSIS — I9589 Other hypotension: Secondary | ICD-10-CM | POA: Diagnosis present

## 2016-05-15 DIAGNOSIS — I959 Hypotension, unspecified: Secondary | ICD-10-CM | POA: Diagnosis present

## 2016-05-15 DIAGNOSIS — Z7982 Long term (current) use of aspirin: Secondary | ICD-10-CM | POA: Diagnosis not present

## 2016-05-15 DIAGNOSIS — Z6841 Body Mass Index (BMI) 40.0 and over, adult: Secondary | ICD-10-CM | POA: Diagnosis not present

## 2016-05-15 DIAGNOSIS — N2581 Secondary hyperparathyroidism of renal origin: Secondary | ICD-10-CM | POA: Diagnosis present

## 2016-05-15 DIAGNOSIS — G894 Chronic pain syndrome: Secondary | ICD-10-CM | POA: Diagnosis present

## 2016-05-15 HISTORY — DX: Other chronic pain: G89.29

## 2016-05-15 HISTORY — DX: Low back pain, unspecified: M54.50

## 2016-05-15 HISTORY — DX: Personal history of other medical treatment: Z92.89

## 2016-05-15 HISTORY — DX: Dependence on supplemental oxygen: Z99.81

## 2016-05-15 HISTORY — DX: Type 2 diabetes mellitus without complications: E11.9

## 2016-05-15 HISTORY — DX: Other pulmonary embolism without acute cor pulmonale: I26.99

## 2016-05-15 HISTORY — DX: End stage renal disease: N18.6

## 2016-05-15 HISTORY — DX: Anxiety disorder, unspecified: F41.9

## 2016-05-15 HISTORY — DX: Obstructive sleep apnea (adult) (pediatric): G47.33

## 2016-05-15 HISTORY — DX: Depression, unspecified: F32.A

## 2016-05-15 HISTORY — DX: Hypothyroidism, unspecified: E03.9

## 2016-05-15 HISTORY — DX: Major depressive disorder, single episode, unspecified: F32.9

## 2016-05-15 HISTORY — DX: Unspecified osteoarthritis, unspecified site: M19.90

## 2016-05-15 HISTORY — DX: Low back pain: M54.5

## 2016-05-15 HISTORY — DX: Dependence on renal dialysis: Z99.2

## 2016-05-15 LAB — BASIC METABOLIC PANEL
ANION GAP: 14 (ref 5–15)
BUN: 68 mg/dL — ABNORMAL HIGH (ref 6–20)
CHLORIDE: 100 mmol/L — AB (ref 101–111)
CO2: 23 mmol/L (ref 22–32)
CREATININE: 16.63 mg/dL — AB (ref 0.44–1.00)
Calcium: 8.9 mg/dL (ref 8.9–10.3)
GFR calc non Af Amer: 2 mL/min — ABNORMAL LOW (ref >60)
GFR, EST AFRICAN AMERICAN: 3 mL/min — AB (ref >60)
Glucose, Bld: 75 mg/dL (ref 65–99)
POTASSIUM: 6.1 mmol/L — AB (ref 3.5–5.1)
SODIUM: 137 mmol/L (ref 135–145)

## 2016-05-15 LAB — CBC
HEMATOCRIT: 34.4 % — AB (ref 36.0–46.0)
Hemoglobin: 10.5 g/dL — ABNORMAL LOW (ref 12.0–15.0)
MCH: 28 pg (ref 26.0–34.0)
MCHC: 30.5 g/dL (ref 30.0–36.0)
MCV: 91.7 fL (ref 78.0–100.0)
PLATELETS: 218 10*3/uL (ref 150–400)
RBC: 3.75 MIL/uL — AB (ref 3.87–5.11)
RDW: 18.1 % — ABNORMAL HIGH (ref 11.5–15.5)
WBC: 10.4 10*3/uL (ref 4.0–10.5)

## 2016-05-15 LAB — I-STAT TROPONIN, ED: Troponin i, poc: 0.05 ng/mL (ref 0.00–0.08)

## 2016-05-15 SURGERY — UPPER EXTREMITY ANGIOGRAPHY
Anesthesia: LOCAL | Laterality: Right

## 2016-05-15 MED ORDER — LEVOTHYROXINE SODIUM 88 MCG PO TABS
88.0000 ug | ORAL_TABLET | Freq: Every day | ORAL | Status: DC
  Administered 2016-05-17 – 2016-05-22 (×6): 88 ug via ORAL
  Filled 2016-05-15 (×7): qty 1

## 2016-05-15 MED ORDER — HYDROXYZINE HCL 25 MG PO TABS
50.0000 mg | ORAL_TABLET | Freq: Three times a day (TID) | ORAL | Status: DC | PRN
  Administered 2016-05-20: 50 mg via ORAL
  Filled 2016-05-15: qty 2

## 2016-05-15 MED ORDER — CINACALCET HCL 30 MG PO TABS
30.0000 mg | ORAL_TABLET | Freq: Every day | ORAL | Status: DC
  Administered 2016-05-17 – 2016-05-22 (×6): 30 mg via ORAL
  Filled 2016-05-15 (×7): qty 1

## 2016-05-15 MED ORDER — HYDROCODONE-ACETAMINOPHEN 5-325 MG PO TABS
1.0000 | ORAL_TABLET | Freq: Four times a day (QID) | ORAL | Status: DC | PRN
  Administered 2016-05-16 – 2016-05-22 (×16): 1 via ORAL
  Filled 2016-05-15 (×15): qty 1

## 2016-05-15 MED ORDER — MORPHINE SULFATE (PF) 4 MG/ML IV SOLN
4.0000 mg | Freq: Once | INTRAVENOUS | Status: AC
  Administered 2016-05-15: 4 mg via INTRAVENOUS
  Filled 2016-05-15: qty 1

## 2016-05-15 MED ORDER — GABAPENTIN 300 MG PO CAPS
300.0000 mg | ORAL_CAPSULE | Freq: Three times a day (TID) | ORAL | Status: DC
  Administered 2016-05-16 – 2016-05-22 (×16): 300 mg via ORAL
  Filled 2016-05-15 (×16): qty 1

## 2016-05-15 MED ORDER — HEPARIN (PORCINE) IN NACL 100-0.45 UNIT/ML-% IJ SOLN
2150.0000 [IU]/h | INTRAMUSCULAR | Status: DC
  Administered 2016-05-16 (×2): 1750 [IU]/h via INTRAVENOUS
  Administered 2016-05-17: 1650 [IU]/h via INTRAVENOUS
  Administered 2016-05-17 – 2016-05-19 (×3): 1950 [IU]/h via INTRAVENOUS
  Administered 2016-05-20 – 2016-05-22 (×4): 2150 [IU]/h via INTRAVENOUS
  Filled 2016-05-15 (×15): qty 250

## 2016-05-15 MED ORDER — TRAZODONE HCL 50 MG PO TABS
50.0000 mg | ORAL_TABLET | Freq: Every evening | ORAL | Status: DC | PRN
  Administered 2016-05-16 – 2016-05-19 (×3): 50 mg via ORAL
  Filled 2016-05-15 (×3): qty 1

## 2016-05-15 MED ORDER — SEVELAMER CARBONATE 800 MG PO TABS
800.0000 mg | ORAL_TABLET | Freq: Three times a day (TID) | ORAL | Status: DC
  Administered 2016-05-16 – 2016-05-19 (×7): 800 mg via ORAL
  Filled 2016-05-15 (×9): qty 1

## 2016-05-15 MED ORDER — SODIUM POLYSTYRENE SULFONATE 15 GM/60ML PO SUSP
30.0000 g | Freq: Once | ORAL | Status: AC
  Administered 2016-05-15: 30 g via ORAL
  Filled 2016-05-15: qty 120

## 2016-05-15 MED ORDER — MIRTAZAPINE 7.5 MG PO TABS
7.5000 mg | ORAL_TABLET | Freq: Every day | ORAL | Status: DC
  Administered 2016-05-16 – 2016-05-22 (×7): 7.5 mg via ORAL
  Filled 2016-05-15 (×7): qty 1

## 2016-05-15 MED ORDER — DULOXETINE HCL 30 MG PO CPEP
30.0000 mg | ORAL_CAPSULE | Freq: Every day | ORAL | Status: DC
  Administered 2016-05-17: 30 mg via ORAL
  Filled 2016-05-15 (×2): qty 1

## 2016-05-15 MED ORDER — DOCUSATE SODIUM 100 MG PO CAPS
100.0000 mg | ORAL_CAPSULE | Freq: Two times a day (BID) | ORAL | Status: DC | PRN
Start: 2016-05-15 — End: 2016-05-22
  Administered 2016-05-19: 100 mg via ORAL
  Filled 2016-05-15: qty 1

## 2016-05-15 MED ORDER — HEPARIN BOLUS VIA INFUSION
5000.0000 [IU] | Freq: Once | INTRAVENOUS | Status: AC
  Administered 2016-05-16: 5000 [IU] via INTRAVENOUS
  Filled 2016-05-15: qty 5000

## 2016-05-15 MED ORDER — DULOXETINE HCL 60 MG PO CPEP
60.0000 mg | ORAL_CAPSULE | Freq: Every day | ORAL | Status: DC
  Administered 2016-05-17: 60 mg via ORAL
  Filled 2016-05-15 (×2): qty 1

## 2016-05-15 MED ORDER — CYCLOBENZAPRINE HCL 10 MG PO TABS
10.0000 mg | ORAL_TABLET | Freq: Three times a day (TID) | ORAL | Status: DC | PRN
  Administered 2016-05-17 – 2016-05-19 (×2): 10 mg via ORAL
  Filled 2016-05-15 (×2): qty 1

## 2016-05-15 MED ORDER — MIDODRINE HCL 5 MG PO TABS
5.0000 mg | ORAL_TABLET | Freq: Two times a day (BID) | ORAL | Status: DC
  Administered 2016-05-16 – 2016-05-17 (×2): 5 mg via ORAL
  Filled 2016-05-15: qty 1

## 2016-05-15 MED ORDER — ASPIRIN EC 81 MG PO TBEC: 81.0000 mg | DELAYED_RELEASE_TABLET | Freq: Every day | ORAL | Status: DC

## 2016-05-15 NOTE — ED Provider Notes (Addendum)
CSN: [id number]     Arrival date & time 05/15/16  1421 History   First MD Initiated Contact with Patient 05/15/16 1816     Chief Complaint  Patient presents with  . Mass     (Consider location/radiation/quality/duration/timing/severity/associated sxs/prior Treatment) HPI.Marland KitchenMarland KitchenMarland KitchenStatus post central line placement on right chest wall today for dialysis access. Catheter had been in place for approximately one year but had become clotted. Patient no longer uses the fistula in her left upper extremity. Apparently ,cental line catheter was clotted and an attempt was made today  by Dr. Juel Burrow [nephrology]  to angioplasty the line [without success].  Patient was instructed to come to the emergency department to resolve this issue. She has no specific complaints of chest pain, dyspnea, extremity edema, cough. Severity is moderate.  Past Medical History  Diagnosis Date  . Diabetes mellitus without complication (HCC)   . Dialysis patient [facility])    Past Surgical History  Procedure Laterality Date  . Below knee leg amputation    . Orthopedic surgery     No family history on file. Social History  Substance Use Topics  . Smoking status: Never Smoker   . Smokeless tobacco: Never Used  . Alcohol Use: No   OB History    No data available     Review of Systems  All other systems reviewed and are negative.     Allergies  Penicillins; Sulfa antibiotics; and Sulfamethoxazole-trimethoprim  Home Medications   Prior to Admission medications   Medication Sig Start Date End Date Taking? Authorizing Provider  aspirin 81 MG tablet Take 81 mg by mouth daily.   Yes Historical Provider, MD  cinacalcet (SENSIPAR) 30 MG tablet Take 30 mg by mouth daily with breakfast.    Yes Historical Provider, MD  cyclobenzaprine (FLEXERIL) 10 MG tablet Take 10 mg by mouth 3 (three) times daily as needed for muscle spasms.  03/29/15  Yes Historical Provider, MD  Docusate Sodium 100 MG capsule Take 100 mg by mouth 2 (two)  times daily as needed for constipation.  10/30/12  Yes Historical Provider, MD  DULoxetine (CYMBALTA) 30 MG capsule Take 30 mg by mouth daily. 05/07/16  Yes Historical Provider, MD  DULoxetine (CYMBALTA) 60 MG capsule Take 60 mg by mouth daily. 05/07/16  Yes Historical Provider, MD  gabapentin (NEURONTIN) 600 MG tablet Take 300 mg by mouth 3 (three) times daily.  04/16/15  Yes Historical Provider, MD  HYDROcodone-acetaminophen (NORCO/VICODIN) 5-325 MG tablet Take 1 tablet by mouth every 6 (six) hours as needed for moderate pain.   Yes Historical Provider, MD  hydrOXYzine (ATARAX/VISTARIL) 50 MG tablet Take 50 mg by mouth 3 (three) times daily as needed for itching.  11/03/15  Yes Historical Provider, MD  levothyroxine (SYNTHROID, LEVOTHROID) 88 MCG tablet Take 88 mcg by mouth daily before breakfast.  08/27/13  Yes Historical Provider, MD  midodrine (PROAMATINE) 5 MG tablet Take 5 mg by mouth 2 (two) times daily with a meal.    Yes Historical Provider, MD  mirtazapine (REMERON) 7.5 MG tablet Take 7.5 mg by mouth at bedtime.  04/28/14  Yes Historical Provider, MD  sevelamer carbonate (RENVELA) 800 MG tablet Take 800 mg by mouth 3 (three) times daily with meals.    Yes Historical Provider, MD  traZODone (DESYREL) 50 MG tablet Take 50 mg by mouth at bedtime as needed for sleep.   Yes Historical Provider, MD   BP 100/59 mmHg  Pulse 113  Temp(Src) 98.4 F (36.9 C) (Oral)  Resp  15  SpO2 98% Physical Exam  Constitutional: She is oriented to person, place, and time.  Obese, no acute distress  HENT:  Head: Normocephalic and atraumatic.  Eyes: Conjunctivae and EOM are normal. Pupils are equal, round, and reactive to light.  Neck: Normal range of motion. Neck supple.  Cardiovascular: Normal rate and regular rhythm.   Pulmonary/Chest: Effort normal and breath sounds normal.  Central line catheter exiting from right chest wall  Abdominal: Soft. Bowel sounds are normal.  Musculoskeletal: Normal range of  motion.  Neurological: She is alert and oriented to person, place, and time.  Skin: Skin is warm and dry.  Psychiatric: She has a normal mood and affect. Her behavior is normal.  Nursing note and vitals reviewed.   ED Course  Procedures (including critical care time) Labs Review Labs Reviewed  BASIC METABOLIC PANEL - Abnormal; Notable for the following:    Potassium 6.1 (*)    Chloride 100 (*)    BUN 68 (*)    Creatinine, Ser 16.63 (*)    GFR calc non Af Amer 2 (*)    GFR calc Af Amer 3 (*)    All other components within normal limits  CBC - Abnormal; Notable for the following:    RBC 3.75 (*)    Hemoglobin 10.5 (*)    HCT 34.4 (*)    RDW 18.1 (*)    All other components within normal limits  [name], ED    Imaging Review Dg Chest 2 View  05/15/2016  CLINICAL DATA:  Dizziness with syncopal episodes for 1 week. Hemodialysis patient. EXAM: CHEST  2 VIEW COMPARISON:  None. FINDINGS: Examination is limited by body habitus and suboptimal inspiration. There is asymmetric elevation of the right hemidiaphragm with associated right basilar airspace disease. There is a minimal left lower lobe atelectasis. No edema or significant pleural effusion is present. There is a right IJ dialysis catheter which extends into the right atrium, tip incompletely visualized. The heart size is normal. IMPRESSION: Asymmetric right lower lobe atelectasis or infiltrate. The tip of the dialysis catheter is incompletely visualized. Electronically Signed   By: Carey Bullocks M.D.   On: 05/15/2016 20:00   I have personally reviewed and evaluated these images and lab results as part of my medical decision-making.   EKG Interpretation   Date/Time:  Monday May 15 2016 14:33:30 EDT Ventricular Rate:  110 PR Interval:  144 QRS Duration: 74 QT Interval:  354 QTC Calculation: 479 R Axis:   73 Text Interpretation:  Sinus tachycardia Low voltage QRS Cannot rule out  Anterior infarct , age undetermined  Abnormal ECG Confirmed by Tearsa Kowalewski  MD,  Annaleise Burger ([id number]) on 05/15/2016 7:00:01 PM     CRITICAL CARE Performed by: Donnetta Hutching Total critical care time: 30 minutes Critical care time was exclusive of separately billable procedures and treating other patients. Critical care was necessary to treat or prevent imminent or life-threatening deterioration. Critical care was time spent personally by me on the following activities: development of treatment plan with patient and/or surrogate as well as nursing, discussions with consultants, evaluation of patient's response to treatment, examination of patient, obtaining history from patient or surrogate, ordering and performing treatments and interventions, ordering and review of laboratory studies, ordering and review of radiographic studies, pulse oximetry and re-evaluation of patient's condition. MDM   Final diagnoses:  ESRD (end stage renal disease) (HCC)    Patient is hemodynamically stable. Admit to general medicine to obtain vascular access for dialysis. Kayexalate administered  for hyperkalemia.  Discussed with Dr. Julian ReilGardner.    Donnetta HutchingBrian Naquisha Whitehair, MD 05/15/16 [id number]  Donnetta HutchingBrian Kimiah Hibner, MD 05/15/16 2225

## 2016-05-15 NOTE — ED Notes (Signed)
Pt reports to the ED for eval of dizziness and syncopal episodes x 1 week. She reports she was a the vascular center having a catheter exchange done and while this was happening the noted she had a large mass in her "large vein" and so they told her to come here. Denies any CP or SOB. Pt A&Ox4, resp e/u, and skin warm and dry. She is a dialysis pt and she had to stop early because it wouldn't run correctly.

## 2016-05-15 NOTE — Progress Notes (Signed)
ANTICOAGULATION CONSULT NOTE - Initial Consult  Pharmacy Consult for Heparin Indication: suspected VTE  Allergies  Allergen Reactions  . Penicillins Hives and Itching    Has patient had a PCN reaction causing immediate rash, facial/tongue/throat swelling, SOB or lightheadedness with hypotension: Yes Has patient had a PCN reaction causing severe rash involving mucus membranes or skin necrosis: No Has patient had a PCN reaction that required hospitalization No Has patient had a PCN reaction occurring within the last 10 years: No If all of the above answers are "NO", then may proceed with Cephalosporin use.   . Sulfa Antibiotics Hives and Itching  . Sulfamethoxazole-Trimethoprim Hives and Itching    Patient Measurements:   Heparin Dosing Weight: 104 kg  Vital Signs: Temp: 98.4 F (36.9 C) (06/12 1433) Temp Source: Oral (06/12 1433) BP: 120/76 mmHg (06/12 2230) Pulse Rate: 113 (06/12 1433)  Labs:  Recent Labs  05/15/16 1810  HGB 10.5*  HCT 34.4*  PLT 218  CREATININE 16.63*    Estimated Creatinine Clearance: 6.6 mL/min (by C-G formula based on Cr of 16.63).   Medical History: Past Medical History  Diagnosis Date  . Diabetes mellitus without complication (HCC)   . Dialysis patient [facility](HCC)     Medications:   (Not in a hospital admission) Scheduled:  . [START ON 05/16/2016] aspirin EC  81 mg Oral Daily  . [START ON 05/16/2016] cinacalcet  30 mg Oral Q breakfast  . [START ON 05/16/2016] DULoxetine  30 mg Oral Daily  . DULoxetine  60 mg Oral Daily  . gabapentin  300 mg Oral TID  . [START ON 05/16/2016] levothyroxine  88 mcg Oral QAC breakfast  . [START ON 05/16/2016] midodrine  5 mg Oral BID WC  . mirtazapine  7.5 mg Oral QHS  . [START ON 05/16/2016] sevelamer carbonate  800 mg Oral TID WC   Infusions:    Assessment: 45yo female presents with likely thrombus after placement of central line on R chest wall for dialysis. Pharmacy is consulted to dose heparin for  thrombus.   Goal of Therapy:  Heparin level 0.3-0.7 units/ml Monitor platelets by anticoagulation protocol: Yes   Plan:  Give 5000 units bolus x 1 Start heparin infusion at 1750 units/hr Check anti-Xa level in 8 hours and daily while on heparin Continue to monitor H&H and platelets  Arlean Hoppingorey M. Newman PiesBall, PharmD, BCPS Clinical Pharmacist Pager [phone] 05/15/2016,10:41 PM

## 2016-05-15 NOTE — H&P (Signed)
History and Physical    [name]:[id number] DOB: [birth year] DOA: 05/15/2016   PCP: Temple Pacini, DO Chief Complaint:  Chief Complaint  Patient presents with  . Mass    HPI: Christina Walters is a 45 y.o. female with medical history significant of ESRD, on dialysis but hasnt had in about a week due to access problems.  Most recently had an arm graft that clotted off, she had a RUE catheter exchange done today in the office by Dr. Paulene Floor due to her having a very old, non-functional graft in her RIJ for over a year now she says.  Patient states that Dr. Juel Burrow "saw a clot" after putting the graft in, and sent her "in to the ED for admission and blood thinners".  Patient is somewhat of a poor historian.  Patient is asymptomatic clinically at this time and is here because Dr. Juel Burrow sent her in to the ED for admission she says.  ED Course: We were able to get a-hold of Dr. Hyman Hopes who reviewed patient records and says "plan 1) anticoagulation, 2) CT chest".  Review of Systems: As per HPI otherwise 10 point review of systems negative.    Past Medical History  Diagnosis Date  . Diabetes mellitus without complication (HCC)   . Dialysis patient [facility])     Past Surgical History  Procedure Laterality Date  . Below knee leg amputation    . Orthopedic surgery       reports that she has never smoked. She has never used smokeless tobacco. She reports that she does not drink alcohol or use illicit drugs.  Allergies  Allergen Reactions  . Penicillins Hives and Itching    Has patient had a PCN reaction causing immediate rash, facial/tongue/throat swelling, SOB or lightheadedness with hypotension: Yes Has patient had a PCN reaction causing severe rash involving mucus membranes or skin necrosis: No Has patient had a PCN reaction that required hospitalization No Has patient had a PCN reaction occurring within the last 10 years: No If all of the above answers are "NO", then may  proceed with Cephalosporin use.   . Sulfa Antibiotics Hives and Itching  . Sulfamethoxazole-Trimethoprim Hives and Itching    No family history on file.   Prior to Admission medications   Medication Sig Start Date End Date Taking? Authorizing Provider  aspirin 81 MG tablet Take 81 mg by mouth daily.   Yes Historical Provider, MD  cinacalcet (SENSIPAR) 30 MG tablet Take 30 mg by mouth daily with breakfast.    Yes Historical Provider, MD  cyclobenzaprine (FLEXERIL) 10 MG tablet Take 10 mg by mouth 3 (three) times daily as needed for muscle spasms.  03/29/15  Yes Historical Provider, MD  Docusate Sodium 100 MG capsule Take 100 mg by mouth 2 (two) times daily as needed for constipation.  10/30/12  Yes Historical Provider, MD  DULoxetine (CYMBALTA) 30 MG capsule Take 30 mg by mouth daily. 05/07/16  Yes Historical Provider, MD  DULoxetine (CYMBALTA) 60 MG capsule Take 60 mg by mouth daily. 05/07/16  Yes Historical Provider, MD  gabapentin (NEURONTIN) 600 MG tablet Take 300 mg by mouth 3 (three) times daily.  04/16/15  Yes Historical Provider, MD  HYDROcodone-acetaminophen (NORCO/VICODIN) 5-325 MG tablet Take 1 tablet by mouth every 6 (six) hours as needed for moderate pain.   Yes Historical Provider, MD  hydrOXYzine (ATARAX/VISTARIL) 50 MG tablet Take 50 mg by mouth 3 (three) times daily as needed for itching.  11/03/15  Yes Historical Provider, MD  levothyroxine (SYNTHROID, LEVOTHROID) 88 MCG tablet Take 88 mcg by mouth daily before breakfast.  08/27/13  Yes Historical Provider, MD  midodrine (PROAMATINE) 5 MG tablet Take 5 mg by mouth 2 (two) times daily with a meal.    Yes Historical Provider, MD  mirtazapine (REMERON) 7.5 MG tablet Take 7.5 mg by mouth at bedtime.  04/28/14  Yes Historical Provider, MD  sevelamer carbonate (RENVELA) 800 MG tablet Take 800 mg by mouth 3 (three) times daily with meals.    Yes Historical Provider, MD  traZODone (DESYREL) 50 MG tablet Take 50 mg by mouth at bedtime as  needed for sleep.   Yes Historical Provider, MD    Physical Exam: Filed Vitals:   05/15/16 1435 05/15/16 1938 05/15/16 2211 05/15/16 2230  BP: 88/68 103/56 100/59 120/76  Pulse:      Temp:      TempSrc:      Resp:   15 16  SpO2:          Constitutional: NAD, calm, comfortable Eyes: PERRL, lids and conjunctivae normal ENMT: Mucous membranes are moist. Posterior pharynx clear of any exudate or lesions.Normal dentition.  Neck: normal, supple, no masses, no thyromegaly Respiratory: clear to auscultation bilaterally, no wheezing, no crackles. Normal respiratory effort. No accessory muscle use.  Cardiovascular: Regular rate and rhythm, no murmurs / rubs / gallops. No extremity edema. 2+ pedal pulses. No carotid bruits.  Abdomen: no tenderness, no masses palpated. No hepatosplenomegaly. Bowel sounds positive.  Musculoskeletal: no clubbing / cyanosis. No joint deformity upper and lower extremities. Good ROM, no contractures. Normal muscle tone.  Skin: no rashes, lesions, ulcers. No induration Neurologic: CN 2-12 grossly intact. Sensation intact, DTR normal. Strength 5/5 in all 4.  Psychiatric: Normal judgment and insight. Alert and oriented x 3. Normal mood.    Labs on Admission: I have personally reviewed following labs and imaging studies  CBC:  Recent Labs Lab 05/09/16 1755 05/15/16 1810  WBC 10.6* 10.4  NEUTROABS 7.9*  --   HGB 13.6 10.5*  HCT 41.6 34.4*  MCV 89.1 91.7  PLT 223 218   Basic Metabolic Panel:  Recent Labs Lab 05/09/16 1845 05/10/16 0003 05/12/16 1527 05/15/16 1810  NA 131*  --  135 137  K >7.5* 5.4* 5.2* 6.1*  CL 91*  --  96* 100*  CO2 20*  --  25 23  GLUCOSE 154*  --  112* 75  BUN 91*  --  60* 68*  CREATININE 17.84*  --  13.83* 16.63*  CALCIUM 9.3  --  8.7* 8.9   GFR: Estimated Creatinine Clearance: 6.6 mL/min (by C-G formula based on Cr of 16.63). Liver Function Tests: No results for input(s): AST, ALT, ALKPHOS, BILITOT, PROT, ALBUMIN in the  last 168 hours. No results for input(s): LIPASE, AMYLASE in the last 168 hours. No results for input(s): AMMONIA in the last 168 hours. Coagulation Profile: No results for input(s): INR, PROTIME in the last 168 hours. Cardiac Enzymes:  Recent Labs Lab 05/09/16 1755  TROPONINI 0.03   BNP (last 3 results) No results for input(s): PROBNP in the last 8760 hours. HbA1C: No results for input(s): HGBA1C in the last 72 hours. CBG: No results for input(s): GLUCAP in the last 168 hours. Lipid Profile: No results for input(s): CHOL, HDL, LDLCALC, TRIG, CHOLHDL, LDLDIRECT in the last 72 hours. Thyroid Function Tests: No results for input(s): TSH, T4TOTAL, FREET4, T3FREE, THYROIDAB in the last 72 hours. Anemia Panel: No results  for input(s): VITAMINB12, FOLATE, FERRITIN, TIBC, IRON, RETICCTPCT in the last 72 hours. Urine analysis: No results found for: COLORURINE, APPEARANCEUR, LABSPEC, PHURINE, GLUCOSEU, HGBUR, BILIRUBINUR, KETONESUR, PROTEINUR, UROBILINOGEN, NITRITE, LEUKOCYTESUR Sepsis Labs: (procalcitonin:4,lacticidven:4) ) Recent Results (from the past 240 hour(s))  Rapid strep screen     Status: None   Collection Time: 05/09/16  5:25 PM  Result Value Ref Range Status   Streptococcus, Group A Screen (Direct) NEGATIVE NEGATIVE Final    Comment: (NOTE) A Rapid Antigen test may result negative if the antigen level in the sample is below the detection level of this test. The FDA has not cleared this test as a stand-alone test therefore the rapid antigen negative result has reflexed to a Group A Strep culture.   Culture, group A strep     Status: None   Collection Time: 05/09/16  5:25 PM  Result Value Ref Range Status   Specimen Description THROAT  Final   Special Requests NONE Reflexed from [id number]  Final   Culture   Final    NO GROUP A STREP (S.PYOGENES) ISOLATED Performed at [facility]    Report Status 05/12/2016 FINAL  Final     Radiological Exams on  Admission: Dg Chest 2 View  05/15/2016  CLINICAL DATA:  Dizziness with syncopal episodes for 1 week. Hemodialysis patient. EXAM: CHEST  2 VIEW COMPARISON:  None. FINDINGS: Examination is limited by body habitus and suboptimal inspiration. There is asymmetric elevation of the right hemidiaphragm with associated right basilar airspace disease. There is a minimal left lower lobe atelectasis. No edema or significant pleural effusion is present. There is a right IJ dialysis catheter which extends into the right atrium, tip incompletely visualized. The heart size is normal. IMPRESSION: Asymmetric right lower lobe atelectasis or infiltrate. The tip of the dialysis catheter is incompletely visualized. Electronically Signed   By: Carey Bullocks M.D.   On: 05/15/2016 20:00    EKG: Independently reviewed.  Assessment/Plan Principal Problem:   Thrombus of venous dialysis catheter (HCC) Active Problems:   Hyperkalemia   ESRD (end stage renal disease) on dialysis (HCC)   Hypotension   Thrombus associated with R venous dialysis catheter -  1) see ED course above for discussion with Dr. Hyman Hopes  2) heparin gtt per pharm consult for presumed thrombus (which patient says Dr. Juel Burrow told her she has, and Dr. Hyman Hopes presumably saw in Dr. Truman Hayward note).  3) CTA chest  Hyperkalemia -  Kayexelate given in ED by EDP   Repeat K ordered at 0100  Dialysis likely tomorrow AM if possible through the new cath.  Dr. Hyman Hopes will eval in AM  Hypotension - chronic, continue midodrine, currently 120/76 with mild tachycardia of 113 and asymptomatic  ESRD - Dr. Hyman Hopes to see tomorrow     DVT prophylaxis: Heparin gtt Code Status: Full Family Communication: Family at bedside Consults called: Spoke with Dr. Hyman Hopes as above Admission status: Admit to inpatient   Hillary Bow DO [facility] Pager [phone] from 7PM-7AM  If 7AM-7PM, please contact the day physician for the patient [email or web address] Password  [facility]  05/15/2016, 10:45 PM

## 2016-05-15 NOTE — ED Notes (Signed)
Dr. Jared at bedside. 

## 2016-05-15 NOTE — ED Notes (Signed)
MD at bedside. 

## 2016-05-15 NOTE — ED Notes (Signed)
Patient transported to X-ray 

## 2016-05-16 ENCOUNTER — Encounter (HOSPITAL_COMMUNITY): Payer: Medicare Other

## 2016-05-16 ENCOUNTER — Inpatient Hospital Stay (HOSPITAL_COMMUNITY): Payer: Medicare Other

## 2016-05-16 ENCOUNTER — Encounter (HOSPITAL_COMMUNITY): Payer: Self-pay | Admitting: General Practice

## 2016-05-16 DIAGNOSIS — I953 Hypotension of hemodialysis: Secondary | ICD-10-CM

## 2016-05-16 DIAGNOSIS — Z992 Dependence on renal dialysis: Secondary | ICD-10-CM

## 2016-05-16 DIAGNOSIS — E875 Hyperkalemia: Secondary | ICD-10-CM

## 2016-05-16 LAB — POTASSIUM: Potassium: 5.1 mmol/L (ref 3.5–5.1)

## 2016-05-16 LAB — BASIC METABOLIC PANEL
ANION GAP: 15 (ref 5–15)
BUN: 72 mg/dL — ABNORMAL HIGH (ref 6–20)
CALCIUM: 9.1 mg/dL (ref 8.9–10.3)
CO2: 23 mmol/L (ref 22–32)
Chloride: 97 mmol/L — ABNORMAL LOW (ref 101–111)
Creatinine, Ser: 17.47 mg/dL — ABNORMAL HIGH (ref 0.44–1.00)
GFR calc Af Amer: 2 mL/min — ABNORMAL LOW (ref >60)
GFR, EST NON AFRICAN AMERICAN: 2 mL/min — AB (ref >60)
Glucose, Bld: 122 mg/dL — ABNORMAL HIGH (ref 65–99)
POTASSIUM: 5.7 mmol/L — AB (ref 3.5–5.1)
SODIUM: 135 mmol/L (ref 135–145)

## 2016-05-16 LAB — GLUCOSE, CAPILLARY
Glucose-Capillary: 96 mg/dL (ref 65–99)
Glucose-Capillary: 77 mg/dL (ref 65–99)
Glucose-Capillary: 44 mg/dL — CL (ref 65–99)
Glucose-Capillary: 84 mg/dL (ref 65–99)

## 2016-05-16 LAB — HEPARIN LEVEL (UNFRACTIONATED)
HEPARIN UNFRACTIONATED: 0.48 [IU]/mL (ref 0.30–0.70)
HEPARIN UNFRACTIONATED: 0.77 [IU]/mL — AB (ref 0.30–0.70)

## 2016-05-16 LAB — MAGNESIUM: MAGNESIUM: 2.3 mg/dL (ref 1.7–2.4)

## 2016-05-16 LAB — PHOSPHORUS: Phosphorus: 9.4 mg/dL — ABNORMAL HIGH (ref 2.5–4.6)

## 2016-05-16 MED ORDER — SODIUM CHLORIDE 0.9 % IV SOLN: 100.0000 mL | INTRAVENOUS | Status: DC | PRN

## 2016-05-16 MED ORDER — HYDROCODONE-ACETAMINOPHEN 5-325 MG PO TABS
ORAL_TABLET | ORAL | Status: AC
  Filled 2016-05-16: qty 1

## 2016-05-16 MED ORDER — ALTEPLASE 2 MG IJ SOLR: 2.0000 mg | Freq: Once | INTRAMUSCULAR | Status: DC | PRN

## 2016-05-16 MED ORDER — HEPARIN SODIUM (PORCINE) 1000 UNIT/ML DIALYSIS: 1000.0000 [IU] | INTRAMUSCULAR | Status: DC | PRN

## 2016-05-16 MED ORDER — ONDANSETRON HCL 4 MG/2ML IJ SOLN
4.0000 mg | Freq: Four times a day (QID) | INTRAMUSCULAR | Status: DC | PRN
Start: 2016-05-16 — End: 2016-05-22
  Administered 2016-05-16: 4 mg via INTRAVENOUS
  Filled 2016-05-16: qty 2

## 2016-05-16 MED ORDER — LIDOCAINE HCL (PF) 1 % IJ SOLN: 5.0000 mL | INTRAMUSCULAR | Status: DC | PRN

## 2016-05-16 MED ORDER — LIDOCAINE-PRILOCAINE 2.5-2.5 % EX CREA: 1.0000 "application " | TOPICAL_CREAM | CUTANEOUS | Status: DC | PRN

## 2016-05-16 MED ORDER — HEPARIN SODIUM (PORCINE) 1000 UNIT/ML DIALYSIS: 2000.0000 [IU] | INTRAMUSCULAR | Status: DC | PRN

## 2016-05-16 MED ORDER — PENTAFLUOROPROP-TETRAFLUOROETH EX AERO: 1.0000 "application " | INHALATION_SPRAY | CUTANEOUS | Status: DC | PRN

## 2016-05-16 MED ORDER — PENTAFLUOROPROP-TETRAFLUOROETH EX AERO
1.0000 | INHALATION_SPRAY | CUTANEOUS | Status: DC | PRN
Start: 2016-05-16 — End: 2016-05-16

## 2016-05-16 MED ORDER — ALPRAZOLAM 0.25 MG PO TABS
0.2500 mg | ORAL_TABLET | Freq: Three times a day (TID) | ORAL | Status: DC | PRN
  Administered 2016-05-16 – 2016-05-22 (×10): 0.25 mg via ORAL
  Filled 2016-05-16 (×11): qty 1

## 2016-05-16 MED ORDER — IOPAMIDOL (ISOVUE-370) INJECTION 76%
INTRAVENOUS | Status: AC
  Filled 2016-05-16: qty 100

## 2016-05-16 NOTE — Progress Notes (Signed)
Admission note:  Arrival Method: ED stretcher  Mental Orientation: alert & oriented x 4  Telemetry: box #05 applied and CCMD notified  Assessment: completed  Skin: unstagable to lateral side of left thigh  IV: left FA saline lock; left AC with heparin infusing at 17.905mL/hr  Pain: pt denies  Safety Measures: Fall prevention discussed with patient  Admission: in progress 6E Orientation: Patient has been oriented to the unit, staff and to the room.  Family: At the bedside- husband     Christina Walters BSN, RN [employer] Phone [phone]

## 2016-05-16 NOTE — Progress Notes (Signed)
ANTICOAGULATION CONSULT NOTE  Pharmacy Consult for Heparin Indication: suspected VTE  Allergies  Allergen Reactions  . Penicillins Hives and Itching    Has patient had a PCN reaction causing immediate rash, facial/tongue/throat swelling, SOB or lightheadedness with hypotension: Yes Has patient had a PCN reaction causing severe rash involving mucus membranes or skin necrosis: No Has patient had a PCN reaction that required hospitalization No Has patient had a PCN reaction occurring within the last 10 years: No If all of the above answers are "NO", then may proceed with Cephalosporin use.   . Sulfa Antibiotics Hives and Itching  . Sulfamethoxazole-Trimethoprim Hives and Itching    Patient Measurements:   Heparin Dosing Weight: 104 kg  Vital Signs: Temp: 97.3 F (36.3 C) (06/13 0613) Temp Source: Oral (06/13 7846) BP: 112/81 mmHg (06/13 0830) Pulse Rate: 91 (06/13 0800)  Labs:  Recent Labs  05/15/16 1810 05/16/16 0749  HGB 10.5*  --   HCT 34.4*  --   PLT 218  --   HEPARINUNFRC  --  0.48  CREATININE 16.63*  --     Estimated Creatinine Clearance: 6.6 mL/min (by C-G formula based on Cr of 16.63).   Medical History: Past Medical History  Diagnosis Date  . Diabetes mellitus without complication (HCC)   . Dialysis patient [facility])     Medications:  Prescriptions prior to admission  Medication Sig Dispense Refill Last Dose  . aspirin 81 MG tablet Take 81 mg by mouth daily.   05/15/2016 at Unknown time  . cinacalcet (SENSIPAR) 30 MG tablet Take 30 mg by mouth daily with breakfast.    Past Week at Unknown time  . cyclobenzaprine (FLEXERIL) 10 MG tablet Take 10 mg by mouth 3 (three) times daily as needed for muscle spasms.    05/14/2016 at Unknown time  . Docusate Sodium 100 MG capsule Take 100 mg by mouth 2 (two) times daily as needed for constipation.    Past Week at Unknown time  . DULoxetine (CYMBALTA) 30 MG capsule Take 30 mg by mouth daily.   05/14/2016 at Unknown time   . DULoxetine (CYMBALTA) 60 MG capsule Take 60 mg by mouth daily.   05/14/2016 at Unknown time  . gabapentin (NEURONTIN) 600 MG tablet Take 300 mg by mouth 3 (three) times daily.    05/14/2016 at Unknown time  . HYDROcodone-acetaminophen (NORCO/VICODIN) 5-325 MG tablet Take 1 tablet by mouth every 6 (six) hours as needed for moderate pain.   05/14/2016 at Unknown time  . hydrOXYzine (ATARAX/VISTARIL) 50 MG tablet Take 50 mg by mouth 3 (three) times daily as needed for itching.    Past Week at Unknown time  . levothyroxine (SYNTHROID, LEVOTHROID) 88 MCG tablet Take 88 mcg by mouth daily before breakfast.    05/14/2016 at Unknown time  . midodrine (PROAMATINE) 5 MG tablet Take 5 mg by mouth 2 (two) times daily with a meal.    05/15/2016 at Unknown time  . mirtazapine (REMERON) 7.5 MG tablet Take 7.5 mg by mouth at bedtime.    05/14/2016 at Unknown time  . sevelamer carbonate (RENVELA) 800 MG tablet Take 800 mg by mouth 3 (three) times daily with meals.    05/14/2016 at Unknown time  . traZODone (DESYREL) 50 MG tablet Take 50 mg by mouth at bedtime as needed for sleep.   05/14/2016 at Unknown time   Scheduled:  . cinacalcet  30 mg Oral Q breakfast  . DULoxetine  30 mg Oral Daily  . DULoxetine  60 mg Oral Daily  . gabapentin  300 mg Oral TID  . iopamidol      . levothyroxine  88 mcg Oral QAC breakfast  . midodrine  5 mg Oral BID WC  . mirtazapine  7.5 mg Oral QHS  . sevelamer carbonate  800 mg Oral TID WC   Infusions:  . heparin 1,750 Units/hr (05/16/16 0012)    Assessment: 45yo female presents with likely thrombus after placement of central line on R chest wall for dialysis. Pharmacy is consulted to dose heparin for thrombus. No AC pta. CTA confirms RLL nonocclusive PE.  HL therapeutic x1 (0.48) on 1750 units/h. Hg 10.5, plt wnl, no bleed documented.  Goal of Therapy:  Heparin level 0.3-0.7 units/ml Monitor platelets by anticoagulation protocol: Yes   Plan:  Heparin at 1750 units/h 8h HL to  confirm, daily HL/CBC Monitor s/sx bleeding  Babs BertinHaley Prabhav Faulkenberry, PharmD, [facility] Valley Health CenterBCPS Clinical Pharmacist Pager [phone] 05/16/2016 9:04 AM

## 2016-05-16 NOTE — Progress Notes (Signed)
ANTICOAGULATION CONSULT NOTE  Pharmacy Consult for Heparin Indication: suspected VTE  Allergies  Allergen Reactions  . Penicillins Hives and Itching    Has patient had a PCN reaction causing immediate rash, facial/tongue/throat swelling, SOB or lightheadedness with hypotension: Yes Has patient had a PCN reaction causing severe rash involving mucus membranes or skin necrosis: No Has patient had a PCN reaction that required hospitalization No Has patient had a PCN reaction occurring within the last 10 years: No If all of the above answers are "NO", then may proceed with Cephalosporin use.   . Sulfa Antibiotics Hives and Itching  . Sulfamethoxazole-Trimethoprim Hives and Itching    Patient Measurements: Height: 6' (182.9 cm) Weight: (!) 309 lb 15.5 oz (140.6 kg) IBW/kg (Calculated) : 73.1 Heparin Dosing Weight: 104 kg  Vital Signs: Temp: 98.1 F (36.7 C) (06/13 1250) Temp Source: Oral (06/13 1250) BP: 92/67 mmHg (06/13 1630) Pulse Rate: 95 (06/13 1630)  Labs:  Recent Labs  05/15/16 1810 05/16/16 0749 05/16/16 0900 05/16/16 1615  HGB 10.5*  --   --   --   HCT 34.4*  --   --   --   PLT 218  --   --   --   HEPARINUNFRC  --  0.48  --  0.77*  CREATININE 16.63*  --  17.47*  --     Estimated Creatinine Clearance: 6.4 mL/min (by C-G formula based on Cr of 17.47).   Medical History: Past Medical History  Diagnosis Date  . Diabetes mellitus without complication (HCC)   . Dialysis patient [facility](HCC)     Medications:  Prescriptions prior to admission  Medication Sig Dispense Refill Last Dose  . aspirin 81 MG tablet Take 81 mg by mouth daily.   05/15/2016 at Unknown time  . cinacalcet (SENSIPAR) 30 MG tablet Take 30 mg by mouth daily with breakfast.    Past Week at Unknown time  . cyclobenzaprine (FLEXERIL) 10 MG tablet Take 10 mg by mouth 3 (three) times daily as needed for muscle spasms.    05/14/2016 at Unknown time  . Docusate Sodium 100 MG capsule Take 100 mg by mouth 2  (two) times daily as needed for constipation.    Past Week at Unknown time  . DULoxetine (CYMBALTA) 30 MG capsule Take 30 mg by mouth daily.   05/14/2016 at Unknown time  . DULoxetine (CYMBALTA) 60 MG capsule Take 60 mg by mouth daily.   05/14/2016 at Unknown time  . gabapentin (NEURONTIN) 600 MG tablet Take 300 mg by mouth 3 (three) times daily.    05/14/2016 at Unknown time  . HYDROcodone-acetaminophen (NORCO/VICODIN) 5-325 MG tablet Take 1 tablet by mouth every 6 (six) hours as needed for moderate pain.   05/14/2016 at Unknown time  . hydrOXYzine (ATARAX/VISTARIL) 50 MG tablet Take 50 mg by mouth 3 (three) times daily as needed for itching.    Past Week at Unknown time  . levothyroxine (SYNTHROID, LEVOTHROID) 88 MCG tablet Take 88 mcg by mouth daily before breakfast.    05/14/2016 at Unknown time  . midodrine (PROAMATINE) 5 MG tablet Take 5 mg by mouth 2 (two) times daily with a meal.    05/15/2016 at Unknown time  . mirtazapine (REMERON) 7.5 MG tablet Take 7.5 mg by mouth at bedtime.    05/14/2016 at Unknown time  . sevelamer carbonate (RENVELA) 800 MG tablet Take 800 mg by mouth 3 (three) times daily with meals.    05/14/2016 at Unknown time  . traZODone (DESYREL)  50 MG tablet Take 50 mg by mouth at bedtime as needed for sleep.   05/14/2016 at Unknown time   Scheduled:  . cinacalcet  30 mg Oral Q breakfast  . DULoxetine  30 mg Oral Daily  . DULoxetine  60 mg Oral Daily  . gabapentin  300 mg Oral TID  . levothyroxine  88 mcg Oral QAC breakfast  . midodrine  5 mg Oral BID WC  . mirtazapine  7.5 mg Oral QHS  . sevelamer carbonate  800 mg Oral TID WC   Infusions:  . heparin 1,750 Units/hr (05/16/16 1251)    Assessment: 45yo female presents with likely thrombus after placement of central line on R chest wall for dialysis. Pharmacy is consulted to dose heparin for thrombus. No AC pta. CTA confirms RLL nonocclusive PE.  Heparin level this evening is SUPRAtherapeutic (HL 0.77 << 0.48, goal of  0.3-0.7). Since the level was drawn in HD - it could have some slight false elevation or be a true level. No bleeding noted - will reduce the rate and recheck a 8 hr HL.   Goal of Therapy:  Heparin level 0.3-0.7 units/ml Monitor platelets by anticoagulation protocol: Yes   Plan:  1. Reduce heparin to 1650 units/hr (16.5 ml/hr) 2. Will continue to monitor for any signs/symptoms of bleeding and will follow up with heparin level in 8 hours   Georgina Pillion, PharmD, BCPS Clinical Pharmacist Pager: [phone] 05/16/2016 4:48 PM

## 2016-05-16 NOTE — Consult Note (Signed)
Chief Complaint: Patient was seen in consultation today for LUE venous thrombolysis Chief Complaint  Patient presents with  . Mass   at the request of Dr Jeoffrey MassedShanker Ghimire  Referring Physician(s): Dr Jeoffrey MassedShanker Ghimire  Supervising Physician: Malachy MoanMcCullough, Heath  Patient Status: Inpatient  History of Present Illness: Christina Walters is a 45 y.o. female   Pt admitted from Dr Juel BurrowLin office After Right IJ dialysis catheter exchange  Pt has had routine exchanges in Dr Juel BurrowLin office x 1 yr Did notice dialysis catheter was running slow last few weeks While in dialysis 6/10---extremely slow  Appt made for routine exchange - 6/12-  and evaluation in OP office Rt IJ cath was exchanged but was determined pt had a "clot" at catheter tip and vein Was sent to ED for evaluation and admitted 05/15/16  Pt has not experienced any SOB; cough or chest pain Admits to some noticed B hand swelling last week- resolved Admits to syncopal episode week and half ago---resolved- did not see MD Admits to some dizzy spells for few weeks; "Dr said my potassium probably high"  Heparin drip started 6/12 - does feel better already Feels less heaviness in arms and hands  CTA Chest 6/13: IMPRESSION: Acute RIGHT lower lobe nonocclusive pulmonary embolism. RIGHT lower lobe atelectasis and possible infarct though, less likely.  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  Dialysis catheter via RIGHT internal jugular venous approach, distal tip in inferior vena cava.  Request now for IR to evaluate  Offer any procedure? HD cath tip in proper location? Will discuss with Dr Jenene SlickerMcCullough-----Dr Archer AsaMcCullough has reviewed imaging Catheter tip is in IVC As long as functioning well; this location is acceptable  I have seen and examined pt    Past Medical History  Diagnosis Date  . Diabetes mellitus without complication (HCC)   . Dialysis patient [facility](HCC)     Past Surgical  History  Procedure Laterality Date  . Below knee leg amputation    . Orthopedic surgery      Allergies: Penicillins; Sulfa antibiotics; and Sulfamethoxazole-trimethoprim  Medications: Prior to Admission medications   Medication Sig Start Date End Date Taking? Authorizing Provider  aspirin 81 MG tablet Take 81 mg by mouth daily.   Yes Historical Provider, MD  cinacalcet (SENSIPAR) 30 MG tablet Take 30 mg by mouth daily with breakfast.    Yes Historical Provider, MD  cyclobenzaprine (FLEXERIL) 10 MG tablet Take 10 mg by mouth 3 (three) times daily as needed for muscle spasms.  03/29/15  Yes Historical Provider, MD  Docusate Sodium 100 MG capsule Take 100 mg by mouth 2 (two) times daily as needed for constipation.  10/30/12  Yes Historical Provider, MD  DULoxetine (CYMBALTA) 30 MG capsule Take 30 mg by mouth daily. 05/07/16  Yes Historical Provider, MD  DULoxetine (CYMBALTA) 60 MG capsule Take 60 mg by mouth daily. 05/07/16  Yes Historical Provider, MD  gabapentin (NEURONTIN) 600 MG tablet Take 300 mg by mouth 3 (three) times daily.  04/16/15  Yes Historical Provider, MD  HYDROcodone-acetaminophen (NORCO/VICODIN) 5-325 MG tablet Take 1 tablet by mouth every 6 (six) hours as needed for moderate pain.   Yes Historical Provider, MD  hydrOXYzine (ATARAX/VISTARIL) 50 MG tablet Take 50 mg by mouth 3 (three) times daily as needed for itching.  11/03/15  Yes Historical Provider, MD  levothyroxine (SYNTHROID, LEVOTHROID) 88 MCG tablet Take 88 mcg by mouth daily before breakfast.  08/27/13  Yes Historical Provider, MD  midodrine (PROAMATINE)  5 MG tablet Take 5 mg by mouth 2 (two) times daily with a meal.    Yes Historical Provider, MD  mirtazapine (REMERON) 7.5 MG tablet Take 7.5 mg by mouth at bedtime.  04/28/14  Yes Historical Provider, MD  sevelamer carbonate (RENVELA) 800 MG tablet Take 800 mg by mouth 3 (three) times daily with meals.    Yes Historical Provider, MD  traZODone (DESYREL) 50 MG tablet Take 50  mg by mouth at bedtime as needed for sleep.   Yes Historical Provider, MD     No family history on file.  Social History   Social History  . Marital Status: Married    Spouse Name: N/A  . Number of Children: N/A  . Years of Education: N/A   Social History Main Topics  . Smoking status: Never Smoker   . Smokeless tobacco: Never Used  . Alcohol Use: No  . Drug Use: No  . Sexual Activity: Not Asked   Other Topics Concern  . None   Social History Narrative     Review of Systems: A 12 point ROS discussed and pertinent positives are indicated in the HPI above.  All other systems are negative.  Review of Systems  Constitutional: Positive for fatigue. Negative for fever, activity change and appetite change.  HENT: Negative for tinnitus.   Eyes: Negative for visual disturbance.  Respiratory: Negative for cough, shortness of breath and wheezing.   Cardiovascular: Negative for chest pain.  Gastrointestinal: Negative for abdominal pain.  Musculoskeletal: Negative for back pain and joint swelling.  Neurological: Positive for dizziness. Negative for speech difficulty, weakness, light-headedness and headaches.  Psychiatric/Behavioral: Negative for behavioral problems and confusion.    Vital Signs: BP 106/65 mmHg  Pulse 95  Temp(Src) 97.9 F (36.6 C) (Oral)  Resp 18  Ht 6' (1.829 m)  Wt 309 lb 15.5 oz (140.6 kg)  BMI 42.03 kg/m2  SpO2 98%  Physical Exam  Constitutional: She is oriented to person, place, and time.  Cardiovascular: Normal rate, regular rhythm and normal heart sounds.   Pulmonary/Chest: Effort normal and breath sounds normal. She has no wheezes.  Abdominal: Soft. Bowel sounds are normal.  Musculoskeletal: Normal range of motion.  L BKA  No noted edema No appreciable facial swelling   Neurological: She is alert and oriented to person, place, and time.  Skin: Skin is warm and dry.  Psychiatric: She has a normal mood and affect. Her behavior is normal.  Judgment and thought content normal.  Nursing note and vitals reviewed.   Mallampati Score:  MD Evaluation Airway: WNL Heart: WNL Abdomen: WNL Chest/ Lungs: WNL ASA  Classification: 2 Mallampati/Airway Score: Two  Imaging: Dg Chest 2 View  05/15/2016  CLINICAL DATA:  Dizziness with syncopal episodes for 1 week. Hemodialysis patient. EXAM: CHEST  2 VIEW COMPARISON:  None. FINDINGS: Examination is limited by body habitus and suboptimal inspiration. There is asymmetric elevation of the right hemidiaphragm with associated right basilar airspace disease. There is a minimal left lower lobe atelectasis. No edema or significant pleural effusion is present. There is a right IJ dialysis catheter which extends into the right atrium, tip incompletely visualized. The heart size is normal. IMPRESSION: Asymmetric right lower lobe atelectasis or infiltrate. The tip of the dialysis catheter is incompletely visualized. Electronically Signed   By: Carey Bullocks M.D.   On: 05/15/2016 20:00   Ct Angio Chest Pe W/cm &/or Wo Cm  05/16/2016  CLINICAL DATA:  Dizziness and syncopal episodes for 1 week.  Deep vein thrombosis noted at vascular centered to during catheter exchange. History of end-stage renal disease on dialysis and, diabetes. EXAM: CT ANGIOGRAPHY CHEST WITH CONTRAST TECHNIQUE: Multidetector CT imaging of the chest was performed using the standard protocol during bolus administration of intravenous contrast. Multiplanar CT image reconstructions and MIPs were obtained to evaluate the vascular anatomy. CONTRAST:  100 cc Isovue 370 COMPARISON:  Chest radiograph May 15, 2016 FINDINGS: Large body habitus results in overall noisy image quality. PULMONARY ARTERY: Adequate contrast opacification of the pulmonary artery's. Main pulmonary artery is not enlarged. Nearly occlusive RIGHT lower lobe pulmonary artery embolism casting into the segmental branches. MEDIASTINUM: Heart and pericardium are unremarkable, no right  heart strain. Thoracic aorta is normal course and caliber, unremarkable. No lymphadenopathy by CT size criteria. RIGHT internal jugular central venous catheter distal tip and knee inferior vena cava, below the intrahepatic IVC. Contrast outlining probable thrombus in the superior vena cava and brachiocephalic confluence. LUNGS: Tracheobronchial tree is patent, no pneumothorax. RIGHT lower lobe atelectasis. Elevated RIGHT hemidiaphragm. SOFT TISSUES AND OSSEOUS STRUCTURES: Atrophic native kidneys. Multilevel Schmorl's nodes without acute osseous process. Broad thoracolumbar levoscoliosis. Review of the MIP images confirms the above findings. IMPRESSION: Acute RIGHT lower lobe nonocclusive pulmonary embolism. RIGHT lower lobe atelectasis and possible infarct though, less likely. 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 Dialysis catheter via RIGHT internal jugular venous approach, distal tip in inferior vena cava. Acute findings discussed with and reconfirmed by Dr.JARED GARDNER on 05/16/2016 at 3:09 am. Electronically Signed   By: Awilda Metro M.D.   On: 05/16/2016 03:11    Labs:  CBC:  Recent Labs  05/09/16 1755 05/15/16 1810  WBC 10.6* 10.4  HGB 13.6 10.5*  HCT 41.6 34.4*  PLT 223 218    COAGS: No results for input(s): INR, APTT in the last 8760 hours.  BMP:  Recent Labs  05/09/16 1845  05/12/16 1527 05/15/16 1810 05/16/16 0142 05/16/16 0900  NA 131*  --  135 137  --  135  K >7.5*  < > 5.2* 6.1* 5.1 5.7*  CL 91*  --  96* 100*  --  97*  CO2 20*  --  25 23  --  23  GLUCOSE 154*  --  112* 75  --  122*  BUN 91*  --  60* 68*  --  72*  CALCIUM 9.3  --  8.7* 8.9  --  9.1  CREATININE 17.84*  --  13.83* 16.63*  --  17.47*  GFRNONAA 2*  --  3* 2*  --  2*  GFRAA 2*  --  3* 3*  --  2*  < > = values in this interval not displayed.  LIVER FUNCTION TESTS: No results for input(s): BILITOT, AST, ALT, ALKPHOS, PROT, ALBUMIN in the last 8760 hours.  TUMOR  MARKERS: No results for input(s): AFPTM, CEA, CA199, CHROMGRNA in the last 8760 hours.  Assessment and Plan:  ESRD Using Rt IJ perm catheter over 1 yr Last dialysis 6/10---slow running  Periodic exchanges in office with Dr Juel Burrow Most recent exchange 6/12 Noted Clot at cath tip/vein  IR to evaluate pt for possible procedure --if appropriate Will discuss with Dr Archer Asa  Thank you for this interesting consult.  I greatly enjoyed meeting RAYE WIENS and look forward to participating in their care.  A copy of this report was sent to the requesting provider on this date.  Electronically Signed: Ralene Muskrat A 05/16/2016, 12:44 PM   I spent a  total of 40 Minutes    in face to face in clinical consultation, greater than 50% of which was counseling/coordinating care for consideration for possible upper extremity venous thrombolysis

## 2016-05-16 NOTE — Progress Notes (Signed)
CTA results reviewed. Getting IR consult for eval and management of both the dialysis catheter which looks like it needs to be pulled 10cm back and the large SVC clot burden to see if they have anything to add beyond the heparin gtt I have the patient on at this time.  Certainly the patient remains asymptomatic with only small burden PE at this time and no RV strain so no indication tonight for emergent systemic TPA.

## 2016-05-16 NOTE — Consult Note (Addendum)
Renal Service Consult Note [facility]  AHTZIRI JEFFRIES 05/16/2016 Luigi Stuckey D Requesting Physician:  Dr Jerral Ralph  Reason for Consult:  ESRD pt with HD-cath related clot in SVC/ RA HPI: The patient is a 45 y.o. year-old with hist of DM, neph syndrome and ESRD on HD since 2012 approx.  MWF [facility].  Has been HD cath dependent for last 2 yrs, has failed accesses all 4 UE sites and had L thigh AVG infection w graft removal 2 yrs ago.  Has had HD cath replacement for malfunction.  Last week had cath replaced, still didn't work so yest went to [facility] and per Dr Juel Burrow had the cath replaced. He went to balloon what he thought was fibrin sheath and discovered she had large clot burden involving SVC and R atrium.  He removed the cath and replaced it with a longer cath w the tip in the IVC beyond the clot and the cath per Dr Juel Burrow is functional.  Pt sent to ED due to need for anticoagulation for the SVC / RA clot.    She "passed out" wks ago but not since then.  No CP , SOB or hemoptysis.  CT chest here showed small PE x 1.  Denies abd pain, n/v/d, no feverfs, chills or sweats.    Pt grew up in [place] , went to [employer].  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  AFter 2 yrs "my mother asked me to come home" after her father died, so she quit college and went home to live w her mother.  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  She worked for a while but became disable due to back pain and disc ruptures.  Married 22 yrs , moved to [place] w her husband to "get away from my family".  Her mother is currently in SNF in [place].    ROS  denies CP  no joint pain   no HA  no blurry vision  no rash  no diarrhea  no nausea/ vomiting  no dysuria  no difficulty voiding  no change in urine color    Past Medical History  Past Medical History  Diagnosis Date  . Diabetes mellitus without complication (HCC)    . Dialysis patient [facility])    Past Surgical History  Past Surgical History  Procedure Laterality Date  . Below knee leg amputation    . Orthopedic surgery     Family History No family history on file. Social History  reports that she has never smoked. She has never used smokeless tobacco. She reports that she does not drink alcohol or use illicit drugs. Allergies  Allergies  Allergen Reactions  . Penicillins Hives and Itching    Has patient had a PCN reaction causing immediate rash, facial/tongue/throat swelling, SOB or lightheadedness with hypotension: Yes Has patient had a PCN reaction causing severe rash involving mucus membranes or skin necrosis: No Has patient had a PCN reaction that required hospitalization No Has patient had a PCN reaction occurring within the last 10 years: No If all of the above answers are "NO", then may proceed with Cephalosporin use.   . Sulfa Antibiotics Hives and Itching  . Sulfamethoxazole-Trimethoprim Hives and Itching   Home medications Prior to Admission medications   Medication Sig Start Date End Date Taking? Authorizing Provider  aspirin 81 MG tablet Take 81 mg by mouth daily.   Yes Historical Provider, MD  cinacalcet (SENSIPAR) 30 MG tablet Take 30 mg by mouth daily with breakfast.    Yes Historical Provider, MD  cyclobenzaprine (FLEXERIL) 10 MG tablet Take 10 mg by mouth 3 (three) times daily as needed for muscle spasms.  03/29/15  Yes Historical Provider, MD  Docusate Sodium 100 MG capsule Take 100 mg by mouth 2 (two) times daily as needed for constipation.  10/30/12  Yes Historical Provider, MD  DULoxetine (CYMBALTA) 30 MG capsule Take 30 mg by mouth daily. 05/07/16  Yes Historical Provider, MD  DULoxetine (CYMBALTA) 60 MG capsule Take 60 mg by mouth daily. 05/07/16  Yes Historical Provider, MD  gabapentin (NEURONTIN) 600 MG tablet Take 300 mg by mouth 3 (three) times daily.  04/16/15  Yes Historical Provider, MD  HYDROcodone-acetaminophen  (NORCO/VICODIN) 5-325 MG tablet Take 1 tablet by mouth every 6 (six) hours as needed for moderate pain.   Yes Historical Provider, MD  hydrOXYzine (ATARAX/VISTARIL) 50 MG tablet Take 50 mg by mouth 3 (three) times daily as needed for itching.  11/03/15  Yes Historical Provider, MD  levothyroxine (SYNTHROID, LEVOTHROID) 88 MCG tablet Take 88 mcg by mouth daily before breakfast.  08/27/13  Yes Historical Provider, MD  midodrine (PROAMATINE) 5 MG tablet Take 5 mg by mouth 2 (two) times daily with a meal.    Yes Historical Provider, MD  mirtazapine (REMERON) 7.5 MG tablet Take 7.5 mg by mouth at bedtime.  04/28/14  Yes Historical Provider, MD  sevelamer carbonate (RENVELA) 800 MG tablet Take 800 mg by mouth 3 (three) times daily with meals.    Yes Historical Provider, MD  traZODone (DESYREL) 50 MG tablet Take 50 mg by mouth at bedtime as needed for sleep.   Yes Historical Provider, MD   Liver Function Tests No results for input(s): AST, ALT, ALKPHOS, BILITOT, PROT, ALBUMIN in the last 168 hours. No results for input(s): LIPASE, AMYLASE in the last 168 hours. CBC  Recent Labs Lab 05/09/16 1755 05/15/16 1810  WBC 10.6* 10.4  NEUTROABS 7.9*  --   HGB 13.6 10.5*  HCT 41.6 34.4*  MCV 89.1 91.7  PLT 223 218   Basic Metabolic Panel  Recent Labs Lab 05/09/16 1845 05/10/16 0003 05/12/16 1527 05/15/16 1810 05/16/16 0142 05/16/16 0900  NA 131*  --  135 137  --  135  K >7.5* 5.4* 5.2* 6.1* 5.1 5.7*  CL 91*  --  96* 100*  --  97*  CO2 20*  --  25 23  --  23  GLUCOSE 154*  --  112* 75  --  122*  BUN 91*  --  60* 68*  --  72*  CREATININE 17.84*  --  13.83* 16.63*  --  17.47*  CALCIUM 9.3  --  8.7* 8.9  --  9.1   Iron/TIBC/Ferritin/ %Sat No results found for: IRON, TIBC, FERRITIN, IRONPCTSAT  Filed Vitals:   05/16/16 0800 05/16/16 0815 05/16/16 0830 05/16/16 0913  BP: 104/69  112/81 106/65  Pulse: 91   95  Temp:    97.9 F (36.6 C)  TempSrc:    Oral  Resp: Height:    6'  (1.829 m)  Weight:    140.6 kg (309 lb 15.5 oz)  SpO2: 95% 98%  98%   Exam Gen obese, no distress, 2+ facial edema, no UE edema No rash, cyanosis or gangrene Sclera anicteric, throat clear   No jvd or bruits Chest clear bilat RRR no MRG Abd soft ntnd no  mass or ascites +bs obese GU defer MS no joint effusions or deformity Ext no LE or UE edema / no wounds or ulcers Old HD accesses bilat forearms and upper arms Neuro is alert, Ox 3 , nf R IJ cath in place   Dialysis: MWF [facility]   5h  132.5kg  2/2.25 bath  Hep 12,000  R IJ cath Hect 10 ug   Assessment: 1  R atrium/ SVC clot, HD cath assoc - IR investigating, have d/w IR MD.  Pt has facial edema c/w SVC syndrome.  Plan is for IV heparin full anticoag for 2-3 days, if still symptomatic from clot will consider thrombolysis.   2  Hyperkalemia - plan HD today 3  ESRD HD since 2012, cath dependent since 2015 4  Chron hypotension on midodrine 5  Volume get dry wt  6  Depression on medication   Plan - HD today, IV hep  Vinson Moselleob Riven Mabile MD [facility] pager [phone]    cell [phone] 05/16/2016, 11:11 AM

## 2016-05-16 NOTE — Progress Notes (Signed)
PROGRESS NOTE        PATIENT DETAILS Name: Christina Walters Age: 45 y.o. Sex: female Date of Birth: [birth year] Admit Date: 05/15/2016 Admitting Physician No admitting provider for patient encounter. ZOX:WRUEA, OSEI A, DO   Brief Narrative: Patient is a 45 y.o. female with PMHx of ESRD sent to the ED by nephrology for evaluation after unsuccessful attempt to declot right subclavian HD catheter.Found to have Pul embolism in right lower lobe, and Sup vena cava thromboses. Started on IV Heparin and admitted for further evaluation and treatment.   Subjective: No chest pain or SOB. Lying comfortably in bed.   Assessment/Plan: Principal Problem: Acute Pul Embolism:did have a syncopal episode around approximately 10-12 days back, continue IV Heparin. Check Echo-but hemodynamically stable. Once she has completed all IR procedures (see below) she will need initiation of coumadin.  Active Problems: Thrombus of venous dialysis catheter with SVC thromboses:continue IV Hepain-spoke with [facility] (Dr Linward Foster was in the OR-communicated via RN), who suggested that we consult IR. Continue IV Heparin-keep NPO and await IR eval (spoke with Beckey Downing PA-C-IR aware)  Hyperkalemia: given Kayexalate last night-recheck stat. Once HD catheter working will need urgent HD.   ESRD: on HD-claims last HD was this past Saturday-did have issues with her HD catheter then as well. Spoke with Dr Lerry Liner is aware of the patient and will evaluate shortly.  Chronic Hypotension:continue Midodrine-BP currently stable.  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  Hypothyroidism:continue Levothyroxine.  Anxiety/Depression:stable at present-continue Cymbalta  Chronic Pain syndrome: stable-continue Cymbalta/Neurontin and prn Vicodin  S/p Right VWU:[id number] 2/2 to a infected ulcer-has a prosthesis  DVT Prophylaxis: Full dose anticoagulation with Heparin  Code  Status: Full code   Family Communication: Spouse at bedside  Disposition Plan: Remain inpatient-home in 2-3 days  Antimicrobial agents: None  Procedures: None  CONSULTS:  nephrology and IR  Time spent: 25 minutes-Greater than 50% of this time was spent in counseling, explanation of diagnosis, planning of further management, and coordination of care.  MEDICATIONS: Anti-infectives    None      Scheduled Meds: . cinacalcet  30 mg Oral Q breakfast  . DULoxetine  30 mg Oral Daily  . DULoxetine  60 mg Oral Daily  . gabapentin  300 mg Oral TID  . iopamidol      . levothyroxine  88 mcg Oral QAC breakfast  . midodrine  5 mg Oral BID WC  . mirtazapine  7.5 mg Oral QHS  . sevelamer carbonate  800 mg Oral TID WC   Continuous Infusions: . heparin 1,750 Units/hr (05/16/16 0012)   PRN Meds:.cyclobenzaprine, docusate sodium, HYDROcodone-acetaminophen, hydrOXYzine, traZODone   PHYSICAL EXAM: Vital signs: Filed Vitals:   05/16/16 0613 05/16/16 0800 05/16/16 0815 05/16/16 0830  BP: 91/73 104/69  112/81  Pulse: 94 91    Temp: 97.3 F (36.3 C)     TempSrc: Oral     Resp: SpO2: 97% 95% 98%    There were no vitals filed for this visit. There is no weight on file to calculate BMI.   Gen Exam: Awake and alert with clear speech. Not in any distress  Neck: Supple, No JVD.   Chest: B/L Clear.   CVS: S1 S2 Regular, no murmurs.  Abdomen: soft, BS +, non tender, non distended.  Extremities: no edema, Left AKA-no edema in RLE  Neurologic: Non Focal.   Skin: No Rash or lesions   Wounds: N/A.   LABORATORY DATA: CBC:  Recent Labs Lab 05/09/16 1755 05/15/16 1810  WBC 10.6* 10.4  NEUTROABS 7.9*  --   HGB 13.6 10.5*  HCT 41.6 34.4*  MCV 89.1 91.7  PLT 223 218    Basic Metabolic Panel:  Recent Labs Lab 05/09/16 1845 05/10/16 0003 05/12/16 1527 05/15/16 1810 05/16/16 0142  NA 131*  --  135 137  --   K >7.5* 5.4* 5.2* 6.1* 5.1  CL 91*  --  96* 100*  --    CO2 20*  --  25 23  --   GLUCOSE 154*  --  112* 75  --   BUN 91*  --  60* 68*  --   CREATININE 17.84*  --  13.83* 16.63*  --   CALCIUM 9.3  --  8.7* 8.9  --     GFR: Estimated Creatinine Clearance: 6.6 mL/min (by C-G formula based on Cr of 16.63).  Liver Function Tests: No results for input(s): AST, ALT, ALKPHOS, BILITOT, PROT, ALBUMIN in the last 168 hours. No results for input(s): LIPASE, AMYLASE in the last 168 hours. No results for input(s): AMMONIA in the last 168 hours.  Coagulation Profile: No results for input(s): INR, PROTIME in the last 168 hours.  Cardiac Enzymes:  Recent Labs Lab 05/09/16 1755  TROPONINI 0.03    BNP (last 3 results) No results for input(s): PROBNP in the last 8760 hours.  HbA1C: No results for input(s): HGBA1C in the last 72 hours.  CBG: No results for input(s): GLUCAP in the last 168 hours.  Lipid Profile: No results for input(s): CHOL, HDL, LDLCALC, TRIG, CHOLHDL, LDLDIRECT in the last 72 hours.  Thyroid Function Tests: No results for input(s): TSH, T4TOTAL, FREET4, T3FREE, THYROIDAB in the last 72 hours.  Anemia Panel: No results for input(s): VITAMINB12, FOLATE, FERRITIN, TIBC, IRON, RETICCTPCT in the last 72 hours.  Urine analysis: No results found for: COLORURINE, APPEARANCEUR, LABSPEC, PHURINE, GLUCOSEU, HGBUR, BILIRUBINUR, KETONESUR, PROTEINUR, UROBILINOGEN, NITRITE, LEUKOCYTESUR  Sepsis Labs: Lactic Acid, Venous No results found for: LATICACIDVEN  MICROBIOLOGY: Recent Results (from the past 240 hour(s))  Rapid strep screen     Status: None   Collection Time: 05/09/16  5:25 PM  Result Value Ref Range Status   Streptococcus, Group A Screen (Direct) NEGATIVE NEGATIVE Final    Comment: (NOTE) A Rapid Antigen test may result negative if the antigen level in the sample is below the detection level of this test. The FDA has not cleared this test as a stand-alone test therefore the rapid antigen negative result has reflexed  to a Group A Strep culture.   Culture, group A strep     Status: None   Collection Time: 05/09/16  5:25 PM  Result Value Ref Range Status   Specimen Description THROAT  Final   Special Requests NONE Reflexed from [id number]  Final   Culture   Final    NO GROUP A STREP (S.PYOGENES) ISOLATED Performed at [facility]    Report Status 05/12/2016 FINAL  Final    RADIOLOGY STUDIES/RESULTS: Dg Chest 2 View  05/15/2016  CLINICAL DATA:  Dizziness with syncopal episodes for 1 week. Hemodialysis patient. EXAM: CHEST  2 VIEW COMPARISON:  None. FINDINGS: Examination is limited by body habitus and suboptimal inspiration. There is asymmetric elevation of the right hemidiaphragm with associated right basilar airspace disease. There is a minimal left lower lobe atelectasis. No edema  or significant pleural effusion is present. There is a right IJ dialysis catheter which extends into the right atrium, tip incompletely visualized. The heart size is normal. IMPRESSION: Asymmetric right lower lobe atelectasis or infiltrate. The tip of the dialysis catheter is incompletely visualized. Electronically Signed   By: Carey Bullocks M.D.   On: 05/15/2016 20:00   Ct Angio Chest Pe W/cm &/or Wo Cm  05/16/2016  CLINICAL DATA:  Dizziness and syncopal episodes for 1 week. Deep vein thrombosis noted at vascular centered to during catheter exchange. History of end-stage renal disease on dialysis and, diabetes. EXAM: CT ANGIOGRAPHY CHEST WITH CONTRAST TECHNIQUE: Multidetector CT imaging of the chest was performed using the standard protocol during bolus administration of intravenous contrast. Multiplanar CT image reconstructions and MIPs were obtained to evaluate the vascular anatomy. CONTRAST:  100 cc Isovue 370 COMPARISON:  Chest radiograph May 15, 2016 FINDINGS: Large body habitus results in overall noisy image quality. PULMONARY ARTERY: Adequate contrast opacification of the pulmonary artery's. Main pulmonary artery is not  enlarged. Nearly occlusive RIGHT lower lobe pulmonary artery embolism casting into the segmental branches. MEDIASTINUM: Heart and pericardium are unremarkable, no right heart strain. Thoracic aorta is normal course and caliber, unremarkable. No lymphadenopathy by CT size criteria. RIGHT internal jugular central venous catheter distal tip and knee inferior vena cava, below the intrahepatic IVC. Contrast outlining probable thrombus in the superior vena cava and brachiocephalic confluence. LUNGS: Tracheobronchial tree is patent, no pneumothorax. RIGHT lower lobe atelectasis. Elevated RIGHT hemidiaphragm. SOFT TISSUES AND OSSEOUS STRUCTURES: Atrophic native kidneys. Multilevel Schmorl's nodes without acute osseous process. Broad thoracolumbar levoscoliosis. Review of the MIP images confirms the above findings. IMPRESSION: Acute RIGHT lower lobe nonocclusive pulmonary embolism. RIGHT lower lobe atelectasis and possible infarct though, less likely. 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 Dialysis catheter via RIGHT internal jugular venous approach, distal tip in inferior vena cava. Acute findings discussed with and reconfirmed by Dr.JARED GARDNER on 05/16/2016 at 3:09 am. Electronically Signed   By: Awilda Metro M.D.   On: 05/16/2016 03:11     LOS: 1 day   Jeoffrey Massed, MD  [facility] Pager:[phone]  If 7PM-7AM, please contact night-coverage [email or web address] Password [facility] 05/16/2016, 8:54 AM

## 2016-05-16 NOTE — Procedures (Signed)
  I was present at this dialysis session, have reviewed the session itself and made  appropriate changes Christina Moselleob Sherica Paternostro MD [facility] pager [phone]    cell [phone] 05/16/2016, 3:19 PM

## 2016-05-17 ENCOUNTER — Inpatient Hospital Stay (HOSPITAL_COMMUNITY): Payer: Medicare Other

## 2016-05-17 DIAGNOSIS — I82210 Acute embolism and thrombosis of superior vena cava: Secondary | ICD-10-CM

## 2016-05-17 LAB — RENAL FUNCTION PANEL
ALBUMIN: 3.1 g/dL — AB (ref 3.5–5.0)
Anion gap: 15 (ref 5–15)
BUN: 51 mg/dL — AB (ref 6–20)
CALCIUM: 8.9 mg/dL (ref 8.9–10.3)
CHLORIDE: 96 mmol/L — AB (ref 101–111)
CO2: 24 mmol/L (ref 22–32)
CREATININE: 13.25 mg/dL — AB (ref 0.44–1.00)
GFR, EST AFRICAN AMERICAN: 3 mL/min — AB (ref >60)
GFR, EST NON AFRICAN AMERICAN: 3 mL/min — AB (ref >60)
Glucose, Bld: 96 mg/dL (ref 65–99)
PHOSPHORUS: 8.5 mg/dL — AB (ref 2.5–4.6)
Potassium: 4.3 mmol/L (ref 3.5–5.1)
SODIUM: 135 mmol/L (ref 135–145)

## 2016-05-17 LAB — GLUCOSE, CAPILLARY
GLUCOSE-CAPILLARY: 100 mg/dL — AB (ref 65–99)
GLUCOSE-CAPILLARY: 114 mg/dL — AB (ref 65–99)
Glucose-Capillary: 123 mg/dL — ABNORMAL HIGH (ref 65–99)
Glucose-Capillary: 59 mg/dL — ABNORMAL LOW (ref 65–99)

## 2016-05-17 LAB — CBC
HCT: 34.1 % — ABNORMAL LOW (ref 36.0–46.0)
Hemoglobin: 10.7 g/dL — ABNORMAL LOW (ref 12.0–15.0)
MCH: 28.3 pg (ref 26.0–34.0)
MCHC: 31.4 g/dL (ref 30.0–36.0)
MCV: 90.2 fL (ref 78.0–100.0)
PLATELETS: 216 10*3/uL (ref 150–400)
RBC: 3.78 MIL/uL — AB (ref 3.87–5.11)
RDW: 18.1 % — AB (ref 11.5–15.5)
WBC: 9.1 10*3/uL (ref 4.0–10.5)

## 2016-05-17 LAB — HEPARIN LEVEL (UNFRACTIONATED)
HEPARIN UNFRACTIONATED: 0.22 [IU]/mL — AB (ref 0.30–0.70)
HEPARIN UNFRACTIONATED: 0.27 [IU]/mL — AB (ref 0.30–0.70)

## 2016-05-17 MED ORDER — MIDODRINE HCL 5 MG PO TABS
10.0000 mg | ORAL_TABLET | Freq: Two times a day (BID) | ORAL | Status: DC
  Administered 2016-05-17 – 2016-05-22 (×11): 10 mg via ORAL
  Filled 2016-05-17 (×11): qty 2

## 2016-05-17 MED ORDER — IOPAMIDOL (ISOVUE-370) INJECTION 76%
100.0000 mL | Freq: Once | INTRAVENOUS | Status: AC | PRN
  Administered 2016-05-17: 100 mL via INTRAVENOUS

## 2016-05-17 MED ORDER — MIDODRINE HCL 5 MG PO TABS
ORAL_TABLET | ORAL | Status: AC
  Filled 2016-05-17: qty 1

## 2016-05-17 NOTE — Progress Notes (Signed)
PROGRESS NOTE        PATIENT DETAILS Name: Christina Walters Age: 45 y.o. Sex: female Date of Birth: Jan 23, 1971 Admit Date: 05/15/2016 Admitting Physician Hillary Bow, DO ZOX:WRUEA, OSEI A, DO   Brief Narrative: Patient is a 45 y.o. female with PMHx of ESRD sent to the ED by nephrology for evaluation after unsuccessful attempt to declot right subclavian HD catheter.Found to have Pul embolism in right lower lobe, and Sup vena cava thromboses. Started on IV Heparin and admitted for further evaluation and treatment. Note she had catheter exchanged as OP 6/12.  Subjective: Seen on HD this AM. No chest pain or SOB. Lying comfortably in bed, feels that her facial swelling and congestion are improving.  Assessment/Plan: Acute Pul Embolism:did have a syncopal episode around approximately 10-12 days back, continue IV Heparin. Check Echo-but hemodynamically stable. IR has seen and recommend 2-3 days of Heparin and bridge to warfarin if she is improving.  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 Roseanna Rainbow spoke with [facility] (Dr Linward Foster was in the OR-communicated via RN), who suggested that we consult IR. Per IR new catheter is above clot, plan hep gtt for now and thrombolysis only if not improving.  Hyperkalemia: now improved, getting HD.  ESRD: on TTS HD, Dr. Arlean Hopping following, she successfully got HD this AM.  Chronic Hypotension:continue Midodrine-BP currently stable.  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  Hypothyroidism:continue Levothyroxine.  Anxiety/Depression:stable at present-continue Cymbalta  Chronic Pain syndrome: stable-continue Cymbalta/Neurontin and prn Vicodin  S/p Right VWU:[id number] 2/2 to a infected ulcer-has a prosthesis  DVT Prophylaxis: Full dose anticoagulation with Heparin  Code Status: Full code   Family Communication: Spouse at bedside  Disposition Plan: Remain  inpatient, home in several days once she is therapeutically on coumadin.  Antimicrobial agents: None  Procedures: None  CONSULTS:  nephrology and IR  Time spent: 35 minutes-Greater than 50% of this time was spent in counseling, explanation of diagnosis, planning of further management, and coordination of care.  MEDICATIONS: Anti-infectives    None      Scheduled Meds: . cinacalcet  30 mg Oral Q breakfast  . DULoxetine  30 mg Oral Daily  . DULoxetine  60 mg Oral Daily  . gabapentin  300 mg Oral TID  . levothyroxine  88 mcg Oral QAC breakfast  . midodrine  10 mg Oral BID WC  . mirtazapine  7.5 mg Oral QHS  . sevelamer carbonate  800 mg Oral TID WC   Continuous Infusions: . heparin 1,800 Units/hr (05/17/16 0631)   PRN Meds:.ALPRAZolam, cyclobenzaprine, docusate sodium, HYDROcodone-acetaminophen, hydrOXYzine, ondansetron (ZOFRAN) IV, traZODone   PHYSICAL EXAM: Vital signs: Filed Vitals:   05/17/16 1011 05/17/16 1030 05/17/16 1100 05/17/16 1130  BP: 94/60 96/64 94/54  86/60  Pulse: 95 95 97 101  Temp:      TempSrc:      Resp:      Height:      Weight:      SpO2:       Filed Weights   05/16/16 1250 05/16/16 1643 05/17/16 0758  Weight: 140.5 kg (309 lb 11.9 oz) 137.6 kg (303 lb 5.7 oz) 138.7 kg (305 lb 12.5 oz)   Body mass index is 41.46 kg/(m^2).   Gen Exam: Awake and alert with clear speech. Not in any distress  Neck: Supple, No JVD.  Chest: B/L Clear.   CVS: S1 S2 Regular, no murmurs.  Abdomen: soft, BS +, non tender, non distended.  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   Skin: No Rash or lesions   Wounds: N/A.   LABORATORY DATA: CBC:  Recent Labs Lab 05/15/16 1810 05/17/16 0502  WBC 10.4 9.1  HGB 10.5* 10.7*  HCT 34.4* 34.1*  MCV 91.7 90.2  PLT 218 216    Basic Metabolic Panel:  Recent Labs Lab 05/12/16 1527 05/15/16 1810 05/16/16 0142 05/16/16 0900 05/16/16 1345 05/17/16 0502  NA 135 137  --  135   --  135  K 5.2* 6.1* 5.1 5.7*  --  4.3  CL 96* 100*  --  97*  --  96*  CO2 25 23  --  23  --  24  GLUCOSE 112* 75  --  122*  --  96  BUN 60* 68*  --  72*  --  51*  CREATININE 13.83* 16.63*  --  17.47*  --  13.25*  CALCIUM 8.7* 8.9  --  9.1  --  8.9  MG  --   --   --   --  2.3  --   PHOS  --   --   --   --  9.4* 8.5*    GFR: Estimated Creatinine Clearance: 8.4 mL/min (by C-G formula based on Cr of 13.25).  Liver Function Tests:  Recent Labs Lab 05/17/16 0502  ALBUMIN 3.1*   No results for input(s): LIPASE, AMYLASE in the last 168 hours. No results for input(s): AMMONIA in the last 168 hours.  Coagulation Profile: No results for input(s): INR, PROTIME in the last 168 hours.  Cardiac Enzymes: No results for input(s): CKTOTAL, CKMB, CKMBINDEX, TROPONINI in the last 168 hours.  BNP (last 3 results) No results for input(s): PROBNP in the last 8760 hours.  HbA1C: No results for input(s): HGBA1C in the last 72 hours.  CBG:  Recent Labs Lab 05/16/16 0913 05/16/16 1139 05/16/16 1745 05/16/16 1959 05/17/16 0738  GLUCAP 96 77 44* 84 123*    Lipid Profile: No results for input(s): CHOL, HDL, LDLCALC, TRIG, CHOLHDL, LDLDIRECT in the last 72 hours.  Thyroid Function Tests: No results for input(s): TSH, T4TOTAL, FREET4, T3FREE, THYROIDAB in the last 72 hours.  Anemia Panel: No results for input(s): VITAMINB12, FOLATE, FERRITIN, TIBC, IRON, RETICCTPCT in the last 72 hours.  Urine analysis: No results found for: COLORURINE, APPEARANCEUR, LABSPEC, PHURINE, GLUCOSEU, HGBUR, BILIRUBINUR, KETONESUR, PROTEINUR, UROBILINOGEN, NITRITE, LEUKOCYTESUR  Sepsis Labs: Lactic Acid, Venous No results found for: LATICACIDVEN  MICROBIOLOGY: Recent Results (from the past 240 hour(s))  Rapid strep screen     Status: None   Collection Time: 05/09/16  5:25 PM  Result Value Ref Range Status   Streptococcus, Group A Screen (Direct) NEGATIVE NEGATIVE Final    Comment: (NOTE) A Rapid  Antigen test may result negative if the antigen level in the sample is below the detection level of this test. The FDA has not cleared this test as a stand-alone test therefore the rapid antigen negative result has reflexed to a Group A Strep culture.   Culture, group A strep     Status: None   Collection Time: 05/09/16  5:25 PM  Result Value Ref Range Status   Specimen Description THROAT  Final   Special Requests NONE Reflexed from [id number]  Final   Culture   Final    NO GROUP A STREP (S.PYOGENES) ISOLATED  Performed at [facility]    Report Status 05/12/2016 FINAL  Final    RADIOLOGY STUDIES/RESULTS: Dg Chest 2 View  05/15/2016  CLINICAL DATA:  Dizziness with syncopal episodes for 1 week. Hemodialysis patient. EXAM: CHEST  2 VIEW COMPARISON:  None. FINDINGS: Examination is limited by body habitus and suboptimal inspiration. There is asymmetric elevation of the right hemidiaphragm with associated right basilar airspace disease. There is a minimal left lower lobe atelectasis. No edema or significant pleural effusion is present. There is a right IJ dialysis catheter which extends into the right atrium, tip incompletely visualized. The heart size is normal. IMPRESSION: Asymmetric right lower lobe atelectasis or infiltrate. The tip of the dialysis catheter is incompletely visualized. Electronically Signed   By: Carey Bullocks M.D.   On: 05/15/2016 20:00   Ct Angio Chest Pe W/cm &/or Wo Cm  05/16/2016  CLINICAL DATA:  Dizziness and syncopal episodes for 1 week. Deep vein thrombosis noted at vascular centered to during catheter exchange. History of end-stage renal disease on dialysis and, diabetes. EXAM: CT ANGIOGRAPHY CHEST WITH CONTRAST TECHNIQUE: Multidetector CT imaging of the chest was performed using the standard protocol during bolus administration of intravenous contrast. Multiplanar CT image reconstructions and MIPs were obtained to evaluate the vascular anatomy. CONTRAST:  100 cc  Isovue 370 COMPARISON:  Chest radiograph May 15, 2016 FINDINGS: Large body habitus results in overall noisy image quality. PULMONARY ARTERY: Adequate contrast opacification of the pulmonary artery's. Main pulmonary artery is not enlarged. Nearly occlusive RIGHT lower lobe pulmonary artery embolism casting into the segmental branches. MEDIASTINUM: Heart and pericardium are unremarkable, no right heart strain. Thoracic aorta is normal course and caliber, unremarkable. No lymphadenopathy by CT size criteria. RIGHT internal jugular central venous catheter distal tip and knee inferior vena cava, below the intrahepatic IVC. Contrast outlining probable thrombus in the superior vena cava and brachiocephalic confluence. LUNGS: Tracheobronchial tree is patent, no pneumothorax. RIGHT lower lobe atelectasis. Elevated RIGHT hemidiaphragm. SOFT TISSUES AND OSSEOUS STRUCTURES: Atrophic native kidneys. Multilevel Schmorl's nodes without acute osseous process. Broad thoracolumbar levoscoliosis. Review of the MIP images confirms the above findings. IMPRESSION: Acute RIGHT lower lobe nonocclusive pulmonary embolism. RIGHT lower lobe atelectasis and possible infarct though, less likely. 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 Dialysis catheter via RIGHT internal jugular venous approach, distal tip in inferior vena cava. Acute findings discussed with and reconfirmed by Dr.JARED GARDNER on 05/16/2016 at 3:09 am. Electronically Signed   By: Awilda Metro M.D.   On: 05/16/2016 03:11     LOS: 2 days   Janeece Blok Vergie Living, MD  [facility] Pager:[phone]  If 7PM-7AM, please contact night-coverage [email or web address] Password [facility] 05/17/2016, 12:26 PM

## 2016-05-17 NOTE — Care Management Important Message (Signed)
Important Message  Patient Details  Name: Christina SlimKelisha M Judd-Durante MRN: [id number] Date of Birth: January 28, 1971   Medicare Important Message Given:  Yes    Bernadette HoitShoffner, Faustina Gebert Coleman 05/17/2016, 8:10 AM

## 2016-05-17 NOTE — Progress Notes (Signed)
  [facility] Progress Note   Subjective: no new complaints  Filed Vitals:   05/17/16 0900 05/17/16 0930 05/17/16 0956 05/17/16 1011  BP: 91/64 70/50 84/50  94/60  Pulse: 61 100 94 95  Temp:      TempSrc:      Resp:      Height:      Weight:      SpO2:        Inpatient medications: . cinacalcet  30 mg Oral Q breakfast  . DULoxetine  30 mg Oral Daily  . DULoxetine  60 mg Oral Daily  . gabapentin  300 mg Oral TID  . levothyroxine  88 mcg Oral QAC breakfast  . midodrine  5 mg Oral BID WC  . mirtazapine  7.5 mg Oral QHS  . sevelamer carbonate  800 mg Oral TID WC   . heparin 1,800 Units/hr (05/17/16 0631)   ALPRAZolam, cyclobenzaprine, docusate sodium, HYDROcodone-acetaminophen, hydrOXYzine, ondansetron (ZOFRAN) IV, traZODone  Exam: Gen obese, no distress, significant facial swelling No jvd or bruits Chest clear bilat RRR no MRG Abd soft ntnd no mass or ascites +bs obese Ext no LE or UE edema / no wounds or ulcers Old HD accesses bilat forearms and upper arms Neuro is alert, Ox 3 , nf R IJ cath in place   Dialysis: MWF [facility] 5h 132.5kg 2/2.25 bath Hep 12,000 R IJ cath Hect 10 ug   Assessment: 1 R atrium/ SVC clot/ SVC syndrome - HD cath-related. On IV hep, thrombolysis if symptoms don't improve, defer to IR 2 ESRD HD since 2012, cath dependent since 2015 3 Chron hypotension on midodrine, will ^ to 10 tid 4 Volume up 6kg today   Plan - HD today, max UF as tol, cont IV heparin   Vinson Moselleob Melissaann Dizdarevic MD [facility] pager [phone]    cell [phone] 05/17/2016, 11:40 AM    Recent Labs Lab 05/15/16 1810 05/16/16 0142 05/16/16 0900 05/16/16 1345 05/17/16 0502  NA 137  --  135  --  135  K 6.1* 5.1 5.7*  --  4.3  CL 100*  --  97*  --  96*  CO2 23  --  23  --  24  GLUCOSE 75  --  122*  --  96  BUN 68*  --  72*  --  51*  CREATININE 16.63*  --  17.47*  --  13.25*  CALCIUM 8.9  --  9.1  --  8.9  PHOS  --   --   --  9.4*  8.5*    Recent Labs Lab 05/17/16 0502  ALBUMIN 3.1*    Recent Labs Lab 05/15/16 1810 05/17/16 0502  WBC 10.4 9.1  HGB 10.5* 10.7*  HCT 34.4* 34.1*  MCV 91.7 90.2  PLT 218 216   Iron/TIBC/Ferritin/ %Sat No results found for: IRON, TIBC, FERRITIN, IRONPCTSAT

## 2016-05-17 NOTE — Progress Notes (Addendum)
VASCULAR LAB PRELIMINARY  PRELIMINARY  PRELIMINARY  PRELIMINARY  Left upper extremity venous duplex has been completed.   No evidence of thrombus in the left arm or right Internal Jugular vein. No evidence of superfcial thrombus in left arm. Two stent in the upper arm.  Winnona Wargo, RVT, RDMS 05/17/2016, 6:20 PM

## 2016-05-17 NOTE — Progress Notes (Signed)
ANTICOAGULATION CONSULT NOTE  Pharmacy Consult for Heparin Indication: suspected VTE  Allergies  Allergen Reactions  . Penicillins Hives and Itching    Has patient had a PCN reaction causing immediate rash, facial/tongue/throat swelling, SOB or lightheadedness with hypotension: Yes Has patient had a PCN reaction causing severe rash involving mucus membranes or skin necrosis: No Has patient had a PCN reaction that required hospitalization No Has patient had a PCN reaction occurring within the last 10 years: No If all of the above answers are "NO", then may proceed with Cephalosporin use.   . Sulfa Antibiotics Hives and Itching  . Sulfamethoxazole-Trimethoprim Hives and Itching    Patient Measurements: Height: 6' (182.9 cm) Weight: (!) 303 lb 5.7 oz (137.6 kg) IBW/kg (Calculated) : 73.1 Heparin Dosing Weight: 104 kg  Vital Signs: Temp: 99.3 F (37.4 C) (06/14 0538) BP: 108/70 mmHg (06/14 0538) Pulse Rate: 98 (06/14 0538)  Labs:  Recent Labs  05/15/16 1810 05/16/16 0749 05/16/16 0900 05/16/16 1615 05/17/16 0502  HGB 10.5*  --   --   --  10.7*  HCT 34.4*  --   --   --  34.1*  PLT 218  --   --   --  216  HEPARINUNFRC  --  0.48  --  0.77* 0.22*  CREATININE 16.63*  --  17.47*  --   --     Estimated Creatinine Clearance: 6.3 mL/min (by C-G formula based on Cr of 17.47).   Assessment: 45yo female on heparin for RLL nonocclusive PE. Heparin level now down to subtherapeutic after rate decreased to 1650 units/hr. Noted that heparin level drawn yesterday afternoon was drawn in HD so could have had some false elevation. CBC stable. No issues with line or bleeding reported per RN.  Goal of Therapy:  Heparin level 0.3-0.7 units/ml Monitor platelets by anticoagulation protocol: Yes   Plan:  Increase heparin to 1800 units/hr  F/u 8 hr heparin level  Christoper Fabianaron Milady Fleener, PharmD, BCPS Clinical pharmacist, pager [phone] 05/17/2016 6:05 AM

## 2016-05-17 NOTE — Progress Notes (Signed)
ANTICOAGULATION CONSULT NOTE  Pharmacy Consult for Heparin Indication: suspected VTE  Allergies  Allergen Reactions  . Penicillins Hives and Itching    Has patient had a PCN reaction causing immediate rash, facial/tongue/throat swelling, SOB or lightheadedness with hypotension: Yes Has patient had a PCN reaction causing severe rash involving mucus membranes or skin necrosis: No Has patient had a PCN reaction that required hospitalization No Has patient had a PCN reaction occurring within the last 10 years: No If all of the above answers are "NO", then may proceed with Cephalosporin use.   . Sulfa Antibiotics Hives and Itching  . Sulfamethoxazole-Trimethoprim Hives and Itching    Patient Measurements: Height: 6' (182.9 cm) Weight: (!) 305 lb 12.5 oz (138.7 kg) IBW/kg (Calculated) : 73.1 Heparin Dosing Weight: 104 kg  Vital Signs: Temp: 98.4 F (36.9 C) (06/14 1706) Temp Source: Oral (06/14 1706) BP: 92/60 mmHg (06/14 1706) Pulse Rate: 98 (06/14 1706)  Labs:  Recent Labs  05/15/16 1810  05/16/16 0900 05/16/16 1615 05/17/16 0502 05/17/16 1708  HGB 10.5*  --   --   --  10.7*  --   HCT 34.4*  --   --   --  34.1*  --   PLT 218  --   --   --  216  --   HEPARINUNFRC  --   < >  --  0.77* 0.22* 0.27*  CREATININE 16.63*  --  17.47*  --  13.25*  --   < > = values in this interval not displayed.  Estimated Creatinine Clearance: 8.4 mL/min (by C-G formula based on Cr of 13.25).   Assessment: 45yo female on heparin for RLL nonocclusive PE. Heparin level is 0.27 after increase to 1800 units/hr.   Goal of Therapy:  Heparin level 0.3-0.7 units/ml Monitor platelets by anticoagulation protocol: Yes   Plan:  -Increase heparin to 1950 units/hr -Heparin level in 8 hours and daily wth CBC daily  Harland GermanAndrew Rajanee Schuelke, Pharm D 05/17/2016 6:00 PM

## 2016-05-18 DIAGNOSIS — T82868S Thrombosis of vascular prosthetic devices, implants and grafts, sequela: Secondary | ICD-10-CM

## 2016-05-18 DIAGNOSIS — I9589 Other hypotension: Secondary | ICD-10-CM

## 2016-05-18 LAB — HEPARIN LEVEL (UNFRACTIONATED)
Heparin Unfractionated: 0.36 IU/mL (ref 0.30–0.70)
Heparin Unfractionated: 0.4 IU/mL (ref 0.30–0.70)

## 2016-05-18 LAB — CBC
HEMATOCRIT: 30.1 % — AB (ref 36.0–46.0)
HEMOGLOBIN: 9.3 g/dL — AB (ref 12.0–15.0)
MCH: 27.7 pg (ref 26.0–34.0)
MCHC: 30.9 g/dL (ref 30.0–36.0)
MCV: 89.6 fL (ref 78.0–100.0)
Platelets: 216 10*3/uL (ref 150–400)
RBC: 3.36 MIL/uL — ABNORMAL LOW (ref 3.87–5.11)
RDW: 17.9 % — AB (ref 11.5–15.5)
WBC: 8.3 10*3/uL (ref 4.0–10.5)

## 2016-05-18 LAB — HEPATITIS B CORE ANTIBODY, TOTAL: Hep B Core Total Ab: NEGATIVE

## 2016-05-18 LAB — HEPATITIS B SURFACE ANTIGEN: Hepatitis B Surface Ag: NEGATIVE

## 2016-05-18 LAB — GLUCOSE, CAPILLARY
GLUCOSE-CAPILLARY: 123 mg/dL — AB (ref 65–99)
Glucose-Capillary: 92 mg/dL (ref 65–99)
Glucose-Capillary: 105 mg/dL — ABNORMAL HIGH (ref 65–99)
Glucose-Capillary: 118 mg/dL — ABNORMAL HIGH (ref 65–99)

## 2016-05-18 LAB — HEPATITIS B SURFACE ANTIBODY,QUALITATIVE: HEP B S AB: REACTIVE

## 2016-05-18 LAB — MRSA PCR SCREENING: MRSA by PCR: NEGATIVE

## 2016-05-18 MED ORDER — COUMADIN BOOK
Freq: Once | Status: AC
  Administered 2016-05-18: 11:00:00
  Filled 2016-05-18: qty 1

## 2016-05-18 MED ORDER — WARFARIN VIDEO
Freq: Once | Status: AC
  Administered 2016-05-18: 1

## 2016-05-18 MED ORDER — DULOXETINE HCL 60 MG PO CPEP
90.0000 mg | ORAL_CAPSULE | Freq: Every day | ORAL | Status: DC
  Administered 2016-05-18 – 2016-05-22 (×5): 90 mg via ORAL
  Filled 2016-05-18 (×5): qty 1

## 2016-05-18 MED ORDER — WARFARIN - PHARMACIST DOSING INPATIENT
Freq: Every day | Status: DC
  Administered 2016-05-19 – 2016-05-20 (×2)

## 2016-05-18 MED ORDER — WARFARIN SODIUM 10 MG PO TABS
10.0000 mg | ORAL_TABLET | Freq: Once | ORAL | Status: AC
  Administered 2016-05-18: 10 mg via ORAL
  Filled 2016-05-18: qty 1

## 2016-05-18 NOTE — Discharge Summary (Signed)
Physician Discharge Summary  Patient ID: Christina SlimKelisha M Judd-Durante MRN: [id number] DOB/AGE: [birth year] 45 y.o.  Admit date: 05/09/2016 Discharge date: 05/18/2016  Admission Diagnoses: uremia and hyperkalemia  Discharge Diagnoses:  Active Problems:   Hyperkalemia   Discharged Condition: fair  Hospital Course: Patient had missed dialysis and had a poorly functional access- she was admitted for dialysis to get her potassium down with instructions to present for her regular HD treatment at [facility] dialysis the next day   Consults: None  Significant Diagnostic Studies: labs: potassium elevated   Treatments: dialysis: Hemodialysis  Discharge Exam: Blood pressure 90/49, pulse 89, temperature 98.2 F (36.8 C), temperature source Oral, resp. rate 20, height 6' (1.829 m), weight 135.172 kg (298 lb), SpO2 95 %. General appearance: alert Resp: clear to auscultation bilaterally Cardio: regular rate and rhythm, S1, S2 normal, no murmur, click, rub or gallop GI: soft, non-tender; bowel sounds normal; no masses,  no organomegaly Extremities: edema 2 plus permcath- nopn tender  Disposition: 01-Home or Self Care     Medication List    ASK your doctor about these medications        aspirin 81 MG tablet  Take 81 mg by mouth daily.     cinacalcet 30 MG tablet  Commonly known as:  SENSIPAR  Take 30 mg by mouth daily with breakfast.     cyclobenzaprine 10 MG tablet  Commonly known as:  FLEXERIL  Take 10 mg by mouth 3 (three) times daily as needed for muscle spasms.     Docusate Sodium 100 MG capsule  Take 100 mg by mouth 2 (two) times daily as needed for constipation.     gabapentin 600 MG tablet  Commonly known as:  NEURONTIN  Take 300 mg by mouth 3 (three) times daily.     hydrOXYzine 50 MG tablet  Commonly known as:  ATARAX/VISTARIL  Take 50 mg by mouth 3 (three) times daily as needed for itching.     levothyroxine 88 MCG tablet  Commonly known as:  SYNTHROID, LEVOTHROID   Take 88 mcg by mouth daily before breakfast.     midodrine 5 MG tablet  Commonly known as:  PROAMATINE  Take 5 mg by mouth 2 (two) times daily with a meal.     mirtazapine 7.5 MG tablet  Commonly known as:  REMERON  Take 7.5 mg by mouth at bedtime.     RENVELA 800 MG tablet  Generic drug:  sevelamer carbonate  Take 800 mg by mouth 3 (three) times daily with meals.     traZODone 50 MG tablet  Commonly known as:  DESYREL  Take 50 mg by mouth at bedtime as needed for sleep.         Signed: Christy Ehrsam A 05/18/2016, 5:20 PM

## 2016-05-18 NOTE — Progress Notes (Signed)
Patient ID: Alanda SlimKelisha M Judd-Durante, female   DOB: 22-Apr-1971, 45 y.o.   MRN: [id number]   IR aware of pt Was seen in consultation 6/13--- see PA note and Rad attestation note per Dr Malachy MoanHeath McCullough  Pt doing well No swelling noted Dialyzing without issue  Vasc lab LUE doppler 05/17/16: VASCULAR LAB PRELIMINARY PRELIMINARY PRELIMINARY PRELIMINARY  Left upper extremity venous duplex has been completed.  No evidence of thrombus in the left arm or right Internal Jugular vein. No evidence of superfcial thrombus in left arm. Two stent in the upper arm.  Confirmed with Dr Elisabeth Pigeonevine Call if need us

## 2016-05-18 NOTE — Progress Notes (Addendum)
ANTICOAGULATION CONSULT NOTE  Pharmacy Consult for Heparin and warfarin  Indication: suspected VTE  Allergies  Allergen Reactions  . Penicillins Hives and Itching    Has patient had a PCN reaction causing immediate rash, facial/tongue/throat swelling, SOB or lightheadedness with hypotension: Yes Has patient had a PCN reaction causing severe rash involving mucus membranes or skin necrosis: No Has patient had a PCN reaction that required hospitalization No Has patient had a PCN reaction occurring within the last 10 years: No If all of the above answers are "NO", then may proceed with Cephalosporin use.   . Sulfa Antibiotics Hives and Itching  . Sulfamethoxazole-Trimethoprim Hives and Itching    Patient Measurements: Height: 6' (182.9 cm) Weight: (!) 305 lb 12.5 oz (138.7 kg) IBW/kg (Calculated) : 73.1 Heparin Dosing Weight: 104 kg  Vital Signs: Temp: 97.8 F (36.6 C) (06/15 0440) Temp Source: Oral (06/15 0440) BP: 88/56 mmHg (06/15 0440) Pulse Rate: 47 (06/15 0440)  Labs:  Recent Labs  05/15/16 1810  05/16/16 0900  05/17/16 0502 05/17/16 1708 05/18/16 0656  HGB 10.5*  --   --   --  10.7*  --  9.3*  HCT 34.4*  --   --   --  34.1*  --  30.1*  PLT 218  --   --   --  216  --  216  HEPARINUNFRC  --   < >  --   < > 0.22* 0.27* 0.36  CREATININE 16.63*  --  17.47*  --  13.25*  --   --   < > = values in this interval not displayed.  Estimated Creatinine Clearance: 8.4 mL/min (by C-G formula based on Cr of 13.25).   Assessment: 44yo female on heparin and warfarin for RLL nonocclusive PE.  HL therapeutic this morning at 0.36 after most recent rate increase to 1950 units/hr (19.5 ml/hr). CBC stable with no sxs of bleeding. With no plans for any acute interventions to begin warfarin. No baseline INR but no anticoagulation PTA.  Goal of Therapy:  Heparin level 0.3-0.7 units/ml Monitor platelets by anticoagulation protocol: Yes   Plan:  1. Continue heparin at 1950  units/hr 2. Confirmation level in 8 hours  3. Daily HL and INR 4. Warfarin 10 mg x 1 this evening    Pollyann SamplesAndy Estee Yohe, PharmD, BCPS 05/18/2016, 9:39 AM Pager: [phone]

## 2016-05-18 NOTE — Consult Note (Signed)
Chief Complaint: Patient was seen in consultation today for  Chief Complaint  Patient presents with  . Mass     Referring Physician(s): Schertz MD  Supervising Physician: Jolaine Click  Patient Status: Inpatient  History of Present Illness: Christina Walters is a 45 y.o. female who presented two days ago with facial swelling and dizziness. She underwent a right neck HD catheter placement and Dr. Juel Burrow noticed thrombus on the catheter. She underwent CT showing SVC thrombus around catheter. Review of imaging shows that the thrombus is adherent to the catheter and nonocclusive. Contrast traverses through the SVC easily into the RA. Today, she feels much better. She has sat up without dizziness or syncope. She denies visual changes or left arm swelling.  Past Medical History  Diagnosis Date  . Pulmonary embolism (HCC) 05/15/2016    "small one"  . Pneumonia X 1  . OSA (obstructive sleep apnea)     "waiting to get my test done cause I need a new machine" (05/16/2016)  . On home oxygen therapy     "2-3L when I sleep at night" (05/16/2016)  . Hypothyroidism   . Type II diabetes mellitus (HCC) dx'd 1981  . History of blood transfusion 2012    "related to leg OR"  . Arthritis     "left hand" (05/16/2016)  . Chronic lower back pain   . Anxiety   . Depression   . ESRD (end stage renal disease) on dialysis [facility])     "MWF; [facility]" (05/16/2016)    Past Surgical History  Procedure Laterality Date  . Below knee leg amputation Left 2012  . Arteriovenous graft placement Bilateral   . Thrombectomy / arteriovenous graft revision Bilateral   . Dilation and curettage of uterus    . Incision and drainage of wound Right ~ 2011    "bubble mass in my thigh"  . Debridment of decubitus ulcer Bilateral ~ 2011    "hips"    Allergies: Penicillins; Sulfa antibiotics; and Sulfamethoxazole-trimethoprim  Medications: Prior to Admission medications   Medication Sig Start Date End Date  Taking? Authorizing Provider  aspirin 81 MG tablet Take 81 mg by mouth daily.   Yes Historical Provider, MD  cinacalcet (SENSIPAR) 30 MG tablet Take 30 mg by mouth daily with breakfast.    Yes Historical Provider, MD  cyclobenzaprine (FLEXERIL) 10 MG tablet Take 10 mg by mouth 3 (three) times daily as needed for muscle spasms.  03/29/15  Yes Historical Provider, MD  Docusate Sodium 100 MG capsule Take 100 mg by mouth 2 (two) times daily as needed for constipation.  10/30/12  Yes Historical Provider, MD  DULoxetine (CYMBALTA) 30 MG capsule Take 30 mg by mouth daily. 05/07/16  Yes Historical Provider, MD  DULoxetine (CYMBALTA) 60 MG capsule Take 60 mg by mouth daily. 05/07/16  Yes Historical Provider, MD  gabapentin (NEURONTIN) 600 MG tablet Take 300 mg by mouth 3 (three) times daily.  04/16/15  Yes Historical Provider, MD  HYDROcodone-acetaminophen (NORCO/VICODIN) 5-325 MG tablet Take 1 tablet by mouth every 6 (six) hours as needed for moderate pain.   Yes Historical Provider, MD  hydrOXYzine (ATARAX/VISTARIL) 50 MG tablet Take 50 mg by mouth 3 (three) times daily as needed for itching.  11/03/15  Yes Historical Provider, MD  levothyroxine (SYNTHROID, LEVOTHROID) 88 MCG tablet Take 88 mcg by mouth daily before breakfast.  08/27/13  Yes Historical Provider, MD  midodrine (PROAMATINE) 5 MG tablet Take 5 mg by mouth 2 (two)  times daily with a meal.    Yes Historical Provider, MD  mirtazapine (REMERON) 7.5 MG tablet Take 7.5 mg by mouth at bedtime.  04/28/14  Yes Historical Provider, MD  sevelamer carbonate (RENVELA) 800 MG tablet Take 800 mg by mouth 3 (three) times daily with meals.    Yes Historical Provider, MD  traZODone (DESYREL) 50 MG tablet Take 50 mg by mouth at bedtime as needed for sleep.   Yes Historical Provider, MD     History reviewed. No pertinent family history.  Social History   Social History  . Marital Status: Married    Spouse Name: N/A  . Number of Children: N/A  . Years of  Education: N/A   Social History Main Topics  . Smoking status: Never Smoker   . Smokeless tobacco: Never Used  . Alcohol Use: No  . Drug Use: No  . Sexual Activity: Not Currently   Other Topics Concern  . None   Social History Narrative      Review of Systems: A 12 point ROS discussed and pertinent positives are indicated in the HPI above.  All other systems are negative.  Review of Systems  Vital Signs: BP 88/52 mmHg  Pulse 98  Temp(Src) 98.1 F (36.7 C) (Oral)  Resp 20  Ht 6' (1.829 m)  Wt 305 lb 12.5 oz (138.7 kg)  BMI 41.46 kg/m2  SpO2 94%  LMP 02/01/2016  Physical Exam  Constitutional: She is oriented to person, place, and time. She appears well-developed and well-nourished.  HENT:  No evidence of facial or orbital edema  Cardiovascular: Normal rate and regular rhythm.   Pulmonary/Chest: Effort normal.  Neurological: She is alert and oriented to person, place, and time.       Imaging: Dg Chest 2 View  05/15/2016  CLINICAL DATA:  Dizziness with syncopal episodes for 1 week. Hemodialysis patient. EXAM: CHEST  2 VIEW COMPARISON:  None. FINDINGS: Examination is limited by body habitus and suboptimal inspiration. There is asymmetric elevation of the right hemidiaphragm with associated right basilar airspace disease. There is a minimal left lower lobe atelectasis. No edema or significant pleural effusion is present. There is a right IJ dialysis catheter which extends into the right atrium, tip incompletely visualized. The heart size is normal. IMPRESSION: Asymmetric right lower lobe atelectasis or infiltrate. The tip of the dialysis catheter is incompletely visualized. Electronically Signed   By: Carey Bullocks M.D.   On: 05/15/2016 20:00   Ct Angio Chest Pe W/cm &/or Wo Cm  05/16/2016  CLINICAL DATA:  Dizziness and syncopal episodes for 1 week. Deep vein thrombosis noted at vascular centered to during catheter exchange. History of end-stage renal disease on dialysis  and, diabetes. EXAM: CT ANGIOGRAPHY CHEST WITH CONTRAST TECHNIQUE: Multidetector CT imaging of the chest was performed using the standard protocol during bolus administration of intravenous contrast. Multiplanar CT image reconstructions and MIPs were obtained to evaluate the vascular anatomy. CONTRAST:  100 cc Isovue 370 COMPARISON:  Chest radiograph May 15, 2016 FINDINGS: Large body habitus results in overall noisy image quality. PULMONARY ARTERY: Adequate contrast opacification of the pulmonary artery's. Main pulmonary artery is not enlarged. Nearly occlusive RIGHT lower lobe pulmonary artery embolism casting into the segmental branches. MEDIASTINUM: Heart and pericardium are unremarkable, no right heart strain. Thoracic aorta is normal course and caliber, unremarkable. No lymphadenopathy by CT size criteria. RIGHT internal jugular central venous catheter distal tip and knee inferior vena cava, below the intrahepatic IVC. Contrast outlining probable thrombus in  the superior vena cava and brachiocephalic confluence. LUNGS: Tracheobronchial tree is patent, no pneumothorax. RIGHT lower lobe atelectasis. Elevated RIGHT hemidiaphragm. SOFT TISSUES AND OSSEOUS STRUCTURES: Atrophic native kidneys. Multilevel Schmorl's nodes without acute osseous process. Broad thoracolumbar levoscoliosis. Review of the MIP images confirms the above findings. IMPRESSION: Acute RIGHT lower lobe nonocclusive pulmonary embolism. RIGHT lower lobe atelectasis and possible infarct though, less likely. 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 Dialysis catheter via RIGHT internal jugular venous approach, distal tip in inferior vena cava. Acute findings discussed with and reconfirmed by Dr.JARED GARDNER on 05/16/2016 at 3:09 am. Electronically Signed   By: Awilda Metroourtnay  Bloomer M.D.   On: 05/16/2016 03:11    Labs:  CBC:  Recent Labs  05/09/16 1755 05/15/16 1810 05/17/16 0502 05/18/16 0656  WBC 10.6* 10.4  9.1 8.3  HGB 13.6 10.5* 10.7* 9.3*  HCT 41.6 34.4* 34.1* 30.1*  PLT 223 218 216 216    COAGS: No results for input(s): INR, APTT in the last 8760 hours.  BMP:  Recent Labs  05/12/16 1527 05/15/16 1810 05/16/16 0142 05/16/16 0900 05/17/16 0502  NA 135 137  --  135 135  K 5.2* 6.1* 5.1 5.7* 4.3  CL 96* 100*  --  97* 96*  CO2 25 23  --  23 24  GLUCOSE 112* 75  --  122* 96  BUN 60* 68*  --  72* 51*  CALCIUM 8.7* 8.9  --  9.1 8.9  CREATININE 13.83* 16.63*  --  17.47* 13.25*  GFRNONAA 3* 2*  --  2* 3*  GFRAA 3* 3*  --  2* 3*    LIVER FUNCTION TESTS:  Recent Labs  05/17/16 0502  ALBUMIN 3.1*    TUMOR MARKERS: No results for input(s): AFPTM, CEA, CA199, CHROMGRNA in the last 8760 hours.  Assessment and Plan:  Christina Walters has nonocclusive thrombus around a left jugular hemodialysis catheter. It is nonocclusive. Her symptoms have improved after heparin and dialysis. Venous thrombectomy or lysis is not indicated at this time.   Thank you for this interesting consult.  I greatly enjoyed meeting Christina Walters and look forward to participating in their care.  A copy of this report was sent to the requesting provider on this date.  Electronically Signed: Fuquan Wilson, ART A 05/18/2016, 3:18 PM   I spent a total of 40 Minutes  in face to face in clinical consultation, greater than 50% of which was counseling/coordinating care for DVT.

## 2016-05-18 NOTE — Progress Notes (Signed)
Pt given coumadin book. Pt read book the RN answered all questions. Pt verbalized understanding. PT also viewed coumadin video. RN reviewed material with pt and answered all questions.  Jonell CluckKadeesha Anatalia Kronk, RN, [facility] [address] [place] Phone [phone]

## 2016-05-18 NOTE — Progress Notes (Signed)
ANTICOAGULATION CONSULT NOTE  Pharmacy Consult for Heparin Indication: VTE  Allergies  Allergen Reactions  . Penicillins Hives and Itching    Has patient had a PCN reaction causing immediate rash, facial/tongue/throat swelling, SOB or lightheadedness with hypotension: Yes Has patient had a PCN reaction causing severe rash involving mucus membranes or skin necrosis: No Has patient had a PCN reaction that required hospitalization No Has patient had a PCN reaction occurring within the last 10 years: No If all of the above answers are "NO", then may proceed with Cephalosporin use.   . Sulfa Antibiotics Hives and Itching  . Sulfamethoxazole-Trimethoprim Hives and Itching    Patient Measurements: Height: 6' (182.9 cm) Weight: (!) 305 lb 12.5 oz (138.7 kg) IBW/kg (Calculated) : 73.1 Heparin Dosing Weight: 104 kg  Vital Signs: Temp: 98.1 F (36.7 C) (06/15 1000) Temp Source: Oral (06/15 1000) BP: 88/52 mmHg (06/15 1000) Pulse Rate: 98 (06/15 1000)  Labs:  Recent Labs  05/16/16 0900  05/17/16 0502 05/17/16 1708 05/18/16 0656 05/18/16 1810  HGB  --   --  10.7*  --  9.3*  --   HCT  --   --  34.1*  --  30.1*  --   PLT  --   --  216  --  216  --   HEPARINUNFRC  --   < > 0.22* 0.27* 0.36 0.40  CREATININE 17.47*  --  13.25*  --   --   --   < > = values in this interval not displayed.  Estimated Creatinine Clearance: 8.4 mL/min (by C-G formula based on Cr of 13.25).   Assessment: 45yo female on heparin and warfarin for RLL nonocclusive PE.  HL therapeutic this morning at 0.36 after most recent rate increase to 1950 units/hr (19.5 ml/hr). CBC stable with no sxs of bleeding. With no plans for any acute interventions to begin warfarin. No baseline INR but no anticoagulation PTA.  --PM HL 0.4 ok. Radiology says mass is nonocclusive and Venous thrombectomy or lysis is not indicated at this time.   Goal of Therapy:  Heparin level 0.3-0.7 units/ml Monitor platelets by  anticoagulation protocol: Yes   Plan:  1. Continue heparin at 1950 units/hr 2. Daily HL and INR     Artrell Lawless S. Merilynn Finlandobertson, PharmD, BCPS Clinical Staff Pharmacist Pager [phone]   05/18/2016, 7:47 PM

## 2016-05-18 NOTE — Care Management Note (Signed)
Case Management Note  Patient Details  Name: Alanda SlimKelisha M Judd-Durante MRN: [id number] Date of Birth: 10-17-1971  Subjective/Objective:     CM following for progression and d/c planning.                Action/Plan: 05/18/2016 No d/c needs identified at this time, will continue to follow.   Expected Discharge Date:                  Expected Discharge Plan:  Home/Self Care  In-House Referral:  NA  Discharge planning Services  CM Consult  Post Acute Care Choice:  NA Choice offered to:  NA  DME Arranged:  N/A DME Agency:  NA  HH Arranged:  NA HH Agency:  NA  Status of Service:  In process, will continue to follow  Medicare Important Message Given:  Yes Date Medicare IM Given:    Medicare IM give by:    Date Additional Medicare IM Given:    Additional Medicare Important Message give by:     If discussed at Long Length of Stay Meetings, dates discussed:    Additional Comments:  Starlyn SkeansRoyal, Sephiroth Mcluckie U, RN 05/18/2016, 12:30 PM

## 2016-05-18 NOTE — Progress Notes (Addendum)
Patient ID: Christina Walters, female   DOB: 1971-05-27, 45 y.o.   MRN: [id number]  PROGRESS NOTE    Christina Walters  GEX:[id number] DOB: 1971-05-27 DOA: 05/15/2016  PCP: Temple PaciniBONSU, OSEI A, DO   Brief Narrative:  45 y.o. female with PMHx of ESRD [facility] after unsuccessful attempt to declot right subclavian HD catheter. Pt was found to have pulmonary embolism in right lower lobe, and superior vena cava thromboses. Started on IV Heparin and admitted for further evaluation and treatment. She had catheter exchanged as OP 6/12.  Assessment & Plan:   Acute Pulmonary Embolism: - Continue heparin drip and start coumadin tonight - Monitor daily INR - Hemodynamically stable   Thrombus of venous dialysis catheter with SVC thromboses - Continue IV Heparin and per IR thrombolysis only if pt not improving  - Appreciate IR following - Start coumadin tonight   Hyperkalemia: now improved, getting HD.  ESRD / Hyperkalemia - On HD TTS HD - Potassium WNL  Chronic Hypotension - Continue Midodrine  Anemia of chronic disease - Stable Hgb, 9.3 this am  Hypothyroidism - Continue Levothyroxine.  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 No evidence of superfcial thrombus in left arm. Two stent in the upper arm  Antimicrobials:   None    Subjective: No overnight events.  Objective: Filed Vitals:   05/17/16 1706 05/17/16 2016 05/18/16 0440 05/18/16 1000  BP: 92/60 73/50 88/56  88/52  Pulse: 98 102 47 98  Temp: 98.4  F (36.9 C) 98.6 F (37 C) 97.8 F (36.6 C) 98.1 F (36.7 C)  TempSrc: Oral Oral Oral Oral  Resp: 20 18 20    Height:      Weight:      SpO2: 98% 94% 98% 94%    Intake/Output Summary (Last 24 hours) at 05/18/16 1057 Last data filed at 05/18/16 0900  Gross per 24 hour  Intake 866.51 ml  Output   2500 ml  Net -1633.49 ml   Filed Weights   05/16/16 1250 05/16/16 1643 05/17/16 0758  Weight: 140.5 kg (309 lb 11.9 oz) 137.6 kg (303 lb 5.7 oz) 138.7 kg (305 lb 12.5 oz)    Examination:  General exam: Appears calm and comfortable  Respiratory system: Clear to auscultation. Respiratory effort normal. Cardiovascular system: S1 & S2 heard, RRR.  Gastrointestinal system: Abdomen is nondistended, soft and nontender.  Central nervous system: Alert and oriented. No focal neurological deficits. Extremities: right AKA, left extremity palpable pulses  Skin: No rashes, lesions or ulcers Psychiatry: Judgement and insight appear normal. Mood & affect appropriate.   Data Reviewed: I have personally reviewed following labs and imaging studies  CBC:  Recent Labs Lab 05/15/16 1810 05/17/16 0502 05/18/16 0656  WBC 10.4 9.1 8.3  HGB 10.5* 10.7* 9.3*  HCT 34.4* 34.1* 30.1*  MCV 91.7 90.2 89.6  PLT 218 216 216   Basic Metabolic Panel:  Recent Labs Lab 05/12/16 1527 05/15/16 1810 05/16/16 0142 05/16/16 0900 05/16/16 1345 05/17/16 0502  NA 135 137  --  135  --  135  K 5.2* 6.1* 5.1 5.7*  --  4.3  CL 96* 100*  --  97*  --  96*  CO2 25 23  --  23  --  24  GLUCOSE 112* 75  --  122*  --  96  BUN 60* 68*  --  72*  --  51*  CREATININE 13.83* 16.63*  --  17.47*  --  13.25*  CALCIUM 8.7* 8.9  --  9.1  --  8.9  MG  --   --   --   --  2.3  --   PHOS  --   --   --   --  9.4* 8.5*   GFR: Estimated Creatinine Clearance: 8.4 mL/min (by C-G formula based on Cr of 13.25). Liver Function Tests:  Recent Labs Lab 05/17/16 0502  ALBUMIN 3.1*   No results for input(s): LIPASE, AMYLASE in  the last 168 hours. No results for input(s): AMMONIA in the last 168 hours. Coagulation Profile: No results for input(s): INR, PROTIME in the last 168 hours. Cardiac Enzymes: No results for input(s): CKTOTAL, CKMB, CKMBINDEX, TROPONINI in the last 168 hours. BNP (last 3 results) No results for input(s): PROBNP in the last 8760 hours. HbA1C: No results for input(s): HGBA1C in the last 72 hours. CBG:  Recent Labs Lab 05/17/16 0738 05/17/16 1420 05/17/16 1639 05/17/16 2014 05/18/16 0731  GLUCAP 123* 59* 100* 114* 92   Lipid Profile: No results for input(s): CHOL, HDL, LDLCALC, TRIG, CHOLHDL, LDLDIRECT in the last 72 hours. Thyroid Function Tests: No results for input(s): TSH, T4TOTAL, FREET4, T3FREE, THYROIDAB in the last 72 hours. Anemia Panel: No results for input(s): VITAMINB12, FOLATE, FERRITIN, TIBC, IRON, RETICCTPCT in the last 72 hours. Urine analysis: No results found for: COLORURINE, APPEARANCEUR, LABSPEC, PHURINE, GLUCOSEU, HGBUR, BILIRUBINUR, KETONESUR, PROTEINUR, UROBILINOGEN, NITRITE, LEUKOCYTESUR Sepsis Labs: (procalcitonin:4,lacticidven:4)   Recent Results (from the past 240 hour(s))  Rapid strep screen     Status: None   Collection Time: 05/09/16  5:25 PM  Result Value Ref Range Status   Streptococcus, Group A Screen (Direct) NEGATIVE NEGATIVE Final  Culture, group A strep     Status: None   Collection Time: 05/09/16  5:25 PM  Result Value Ref Range Status   Specimen Description THROAT  Final   Special Requests NONE Reflexed from [id number]  Final   Culture   Final    NO GROUP A STREP (S.PYOGENES) ISOLATED Performed at [facility]    Report Status 05/12/2016 FINAL  Final  MRSA PCR Screening     Status: None   Collection Time: 05/17/16  9:15 PM  Result Value Ref Range Status   MRSA by PCR NEGATIVE NEGATIVE Final      Radiology Studies: Dg Chest 2 View 05/15/2016  Asymmetric right lower lobe atelectasis or infiltrate. The tip of the  dialysis catheter is incompletely visualized.   Ct Angio Chest Pe W/cm &/or Wo Cm 05/16/2016   Acute RIGHT lower lobe nonocclusive pulmonary embolism. RIGHT lower lobe atelectasis and possible infarct though, less likely. 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 Dialysis catheter via RIGHT internal jugular venous approach, distal tip in inferior vena cava.    Scheduled Meds: . cinacalcet  30 mg Oral Q breakfast  . DULoxetine  90 mg Oral Daily  . gabapentin  300 mg Oral TID  . levothyroxine  88 mcg Oral QAC breakfast  . midodrine  10 mg Oral BID WC  .  mirtazapine  7.5 mg Oral QHS  . sevelamer carbonate  800 mg Oral TID WC   Continuous Infusions: . heparin 1,950 Units/hr (05/17/16 2355)     LOS: 3 days    Time spent: 25 minutes Greater than 50% of the time spent on counseling and coordinating the care.   Manson Passey, MD [facility] Pager [phone]  If 7PM-7AM, please contact night-coverage [email or web address] Password [email or web address] 05/18/2016, 10:57 AM

## 2016-05-19 DIAGNOSIS — Z7901 Long term (current) use of anticoagulants: Secondary | ICD-10-CM

## 2016-05-19 LAB — CBC
HCT: 31 % — ABNORMAL LOW (ref 36.0–46.0)
HCT: 30.9 % — ABNORMAL LOW (ref 36.0–46.0)
HEMOGLOBIN: 9.3 g/dL — AB (ref 12.0–15.0)
Hemoglobin: 9.4 g/dL — ABNORMAL LOW (ref 12.0–15.0)
MCH: 27.3 pg (ref 26.0–34.0)
MCH: 27.6 pg (ref 26.0–34.0)
MCHC: 30 g/dL (ref 30.0–36.0)
MCHC: 30.4 g/dL (ref 30.0–36.0)
MCV: 90.9 fL (ref 78.0–100.0)
MCV: 90.6 fL (ref 78.0–100.0)
PLATELETS: 237 10*3/uL (ref 150–400)
Platelets: 285 10*3/uL (ref 150–400)
RBC: 3.41 MIL/uL — ABNORMAL LOW (ref 3.87–5.11)
RBC: 3.41 MIL/uL — ABNORMAL LOW (ref 3.87–5.11)
RDW: 17.9 % — ABNORMAL HIGH (ref 11.5–15.5)
RDW: 17.8 % — ABNORMAL HIGH (ref 11.5–15.5)
WBC: 8.5 10*3/uL (ref 4.0–10.5)
WBC: 9.6 10*3/uL (ref 4.0–10.5)

## 2016-05-19 LAB — RENAL FUNCTION PANEL
Albumin: 2.8 g/dL — ABNORMAL LOW (ref 3.5–5.0)
Anion gap: 11 (ref 5–15)
BUN: 41 mg/dL — ABNORMAL HIGH (ref 6–20)
CO2: 24 mmol/L (ref 22–32)
Calcium: 8.4 mg/dL — ABNORMAL LOW (ref 8.9–10.3)
Chloride: 99 mmol/L — ABNORMAL LOW (ref 101–111)
Creatinine, Ser: 10.89 mg/dL — ABNORMAL HIGH (ref 0.44–1.00)
GFR calc Af Amer: 4 mL/min — ABNORMAL LOW (ref >60)
GFR calc non Af Amer: 4 mL/min — ABNORMAL LOW (ref >60)
Glucose, Bld: 85 mg/dL (ref 65–99)
Phosphorus: 5.3 mg/dL — ABNORMAL HIGH (ref 2.5–4.6)
Potassium: 4.5 mmol/L (ref 3.5–5.1)
Sodium: 134 mmol/L — ABNORMAL LOW (ref 135–145)

## 2016-05-19 LAB — PROTIME-INR
INR: 1.15 (ref 0.00–1.49)
PROTHROMBIN TIME: 14.9 s (ref 11.6–15.2)

## 2016-05-19 LAB — HEPARIN LEVEL (UNFRACTIONATED): Heparin Unfractionated: 0.48 IU/mL (ref 0.30–0.70)

## 2016-05-19 MED ORDER — HYDROCODONE-ACETAMINOPHEN 5-325 MG PO TABS
ORAL_TABLET | ORAL | Status: AC
  Filled 2016-05-19: qty 1

## 2016-05-19 MED ORDER — LIDOCAINE-PRILOCAINE 2.5-2.5 % EX CREA: 1.0000 "application " | TOPICAL_CREAM | CUTANEOUS | Status: DC | PRN

## 2016-05-19 MED ORDER — PENTAFLUOROPROP-TETRAFLUOROETH EX AERO: 1.0000 "application " | INHALATION_SPRAY | CUTANEOUS | Status: DC | PRN

## 2016-05-19 MED ORDER — ALTEPLASE 2 MG IJ SOLR: 2.0000 mg | Freq: Once | INTRAMUSCULAR | Status: DC | PRN

## 2016-05-19 MED ORDER — RENA-VITE PO TABS
1.0000 | ORAL_TABLET | Freq: Every day | ORAL | Status: DC
  Administered 2016-05-19 – 2016-05-21 (×3): 1 via ORAL
  Filled 2016-05-19 (×3): qty 1

## 2016-05-19 MED ORDER — SODIUM CHLORIDE 0.9 % IV SOLN: 100.0000 mL | INTRAVENOUS | Status: DC | PRN

## 2016-05-19 MED ORDER — HEPARIN SODIUM (PORCINE) 1000 UNIT/ML DIALYSIS: 20.0000 [IU]/kg | INTRAMUSCULAR | Status: DC | PRN

## 2016-05-19 MED ORDER — WARFARIN SODIUM 10 MG PO TABS
10.0000 mg | ORAL_TABLET | Freq: Once | ORAL | Status: AC
Start: 2016-05-19 — End: 2016-05-19
  Administered 2016-05-19: 10 mg via ORAL
  Filled 2016-05-19: qty 1

## 2016-05-19 MED ORDER — LIDOCAINE HCL (PF) 1 % IJ SOLN: 5.0000 mL | INTRAMUSCULAR | Status: DC | PRN

## 2016-05-19 MED ORDER — DARBEPOETIN ALFA 40 MCG/0.4ML IJ SOSY
40.0000 ug | PREFILLED_SYRINGE | INTRAMUSCULAR | Status: DC
  Administered 2016-05-19: 40 ug via INTRAVENOUS

## 2016-05-19 MED ORDER — HEPARIN SODIUM (PORCINE) 1000 UNIT/ML DIALYSIS: 1000.0000 [IU] | INTRAMUSCULAR | Status: DC | PRN

## 2016-05-19 MED ORDER — SEVELAMER CARBONATE 800 MG PO TABS
2400.0000 mg | ORAL_TABLET | Freq: Three times a day (TID) | ORAL | Status: DC
  Administered 2016-05-19 – 2016-05-22 (×9): 2400 mg via ORAL
  Filled 2016-05-19 (×10): qty 3

## 2016-05-19 MED ORDER — DARBEPOETIN ALFA 40 MCG/0.4ML IJ SOSY
PREFILLED_SYRINGE | INTRAMUSCULAR | Status: AC
  Administered 2016-05-19: 40 ug via INTRAVENOUS
  Filled 2016-05-19: qty 0.4

## 2016-05-19 NOTE — Progress Notes (Signed)
[facility] Progress Note  Assessment/Plan: 1. Right atrium/SVC clot with SVC syndrome - cath related - coumadin started - need to decide who will managed after d/c; evaluated by IR - clot adherent to catheter and nonocclusive 2. ESRD - MWF - last HD 6/14 - plan HD today K high on admission 6.1; multiple failed access due to clotting - she is not aware if she has ever had a hypercoagulable panel 3. Anemia - hgb dropping 9.3  - start ESA Aranesp 40 4. Secondary hyperparathyroidism - P elevated ^ renvela to 3 ac tid; resume Hectorol 10 5. BP/volume - no post HD wt 6/14 net UF 2.5 6. Nutrition - changed to renal diet/vit  Sheffield Slider, PA-C [facility] Beeper [phone] 05/19/2016,12:25 PM  LOS: 4 days   Pt seen, examined and agree w A/P as above. Appreciate IR input, pt w nonocclusive SVC clot on IV heparin and coumadin started last night.  Leave HD cath in for now.   Vinson Moselle MD [facility] pager [phone]    cell [phone] 05/19/2016, 3:40 PM    Subjective:   Wants to go home. Feels like facial swelling better  Objective Filed Vitals:   05/18/16 1000 05/18/16 1958 05/19/16 0508 05/19/16 0900  BP: 88/52 70/57 117/59 102/58  Pulse: 98 95 80 86  Temp: 98.1 F (36.7 C) 98.2 F (36.8 C) 97.5 F (36.4 C) 97.3 F (36.3 C)  TempSrc: Oral Oral Oral Oral  Resp:  Height:      Weight:      SpO2: 94% 96% 100% 97%   Physical Exam General: NAD, still some facial puffiness Heart: RRR Lungs: no rales Abdomen: obese soft Extremities: left BKA wearing prosthesis - no sig RLE edema Dialysis Access: right IJ  Dialysis Orders: MWF [facility] 5h 132.5kg 2/2.25 bath Hep 12,000 R IJ cath Hect 10 ug  Additional Objective Labs: Lab Results  Component Value Date   INR 1.15 05/19/2016    Basic Metabolic Panel:  Recent Labs Lab 05/15/16 1810 05/16/16 0142 05/16/16 0900 05/16/16 1345 05/17/16 0502  NA 137  --   135  --  135  K 6.1* 5.1 5.7*  --  4.3  CL 100*  --  97*  --  96*  CO2 23  --  23  --  24  GLUCOSE 75  --  122*  --  96  BUN 68*  --  72*  --  51*  CREATININE 16.63*  --  17.47*  --  13.25*  CALCIUM 8.9  --  9.1  --  8.9  PHOS  --   --   --  9.4* 8.5*   Liver Function Tests:  Recent Labs Lab 05/17/16 0502  ALBUMIN 3.1*   CBC:  Recent Labs Lab 05/15/16 1810 05/17/16 0502 05/18/16 0656 05/19/16 0753  WBC 10.4 9.1 8.3 8.5  HGB 10.5* 10.7* 9.3* 9.3*  HCT 34.4* 34.1* 30.1* 31.0*  MCV 91.7 90.2 89.6 90.9  PLT 218 216 216 237   CBG:  Recent Labs Lab 05/17/16 2014 05/18/16 0731 05/18/16 1112 05/18/16 1620 05/18/16 1956  GLUCAP 114* 92 123* 105* 118*   Medications: . heparin 1,950 Units/hr (05/18/16 1208)   . cinacalcet  30 mg Oral Q breakfast  . DULoxetine  90 mg Oral Daily  . gabapentin  300 mg Oral TID  . levothyroxine  88 mcg Oral QAC breakfast  . midodrine  10 mg Oral BID WC  .  mirtazapine  7.5 mg Oral QHS  . sevelamer carbonate  800 mg Oral TID WC  . warfarin  10 mg Oral ONCE-1800  . Warfarin - Pharmacist Dosing Inpatient   Does not apply [phone]

## 2016-05-19 NOTE — Progress Notes (Signed)
ANTICOAGULATION CONSULT NOTE  Pharmacy Consult for Heparin and warfarin  Indication: VTE  Allergies  Allergen Reactions  . Penicillins Hives and Itching    Has patient had a PCN reaction causing immediate rash, facial/tongue/throat swelling, SOB or lightheadedness with hypotension: Yes Has patient had a PCN reaction causing severe rash involving mucus membranes or skin necrosis: No Has patient had a PCN reaction that required hospitalization No Has patient had a PCN reaction occurring within the last 10 years: No If all of the above answers are "NO", then may proceed with Cephalosporin use.   . Sulfa Antibiotics Hives and Itching  . Sulfamethoxazole-Trimethoprim Hives and Itching    Patient Measurements: Height: 6' (182.9 cm) Weight: (!) 305 lb 12.5 oz (138.7 kg) IBW/kg (Calculated) : 73.1 Heparin Dosing Weight: 104 kg  Vital Signs: Temp: 97.3 F (36.3 C) (06/16 0900) Temp Source: Oral (06/16 0900) BP: 102/58 mmHg (06/16 0900) Pulse Rate: 86 (06/16 0900)  Labs:  Recent Labs  05/17/16 0502  05/18/16 0656 05/18/16 1810 05/19/16 0753  HGB 10.7*  --  9.3*  --  9.3*  HCT 34.1*  --  30.1*  --  31.0*  PLT 216  --  216  --  237  LABPROT  --   --   --   --  14.9  INR  --   --   --   --  1.15  HEPARINUNFRC 0.22*  < > 0.36 0.40 0.48  CREATININE 13.25*  --   --   --   --   < > = values in this interval not displayed.  Estimated Creatinine Clearance: 8.4 mL/min (by C-G formula based on Cr of 13.25).   Assessment: 45yo female on heparin and warfarin (started 6/15) for RLL nonocclusive PE.  HL remains therapeutic this morning with heparin infusion running at 1950 units/hr. INR 1.15 this am after one 10 mg dose of warfarin received. CBC stable.    Goal of Therapy:  Heparin level 0.3-0.7 units/ml Monitor platelets by anticoagulation protocol: Yes   Plan:  1. Continue heparin at 1950 units/hr 2. Warfarin 10 mg x 1 this evening  3. Daily HL and INR   Pollyann SamplesAndy Kaevon Cotta,  PharmD, BCPS 05/19/2016, 9:30 AM Pager: [phone]

## 2016-05-19 NOTE — Progress Notes (Signed)
Patient arrived to unit by bed.  Reviewed treatment plan and this RN agrees with plan.  Report received from bedside RN, Miranda.  Consent verified.  Patient A & O X 4.   Lung sounds clear to ausculation in all fields. Generalized edema. Cardiac:  NSR.  Removed caps and cleansed RIJ catheter with chlorhedxidine.  Aspirated ports of heparin and flushed them with saline per protocol.  Connected and secured lines, initiated treatment at 1600.  UF Goal of and net fluid removal 3L.  Will continue to monitor.

## 2016-05-19 NOTE — Care Management Important Message (Signed)
Important Message  Patient Details  Name: Christina Walters MRN: [id number] Date of Birth: 08-11-1971   Medicare Important Message Given:  Yes    Bernadette HoitShoffner, Saidee Geremia Coleman 05/19/2016, 9:31 AM

## 2016-05-19 NOTE — Progress Notes (Signed)
Patient ID: Christina Walters, female   DOB: 20-Apr-1971, 45 y.o.   MRN: [id number]  PROGRESS NOTE    Christina Walters  [id number] DOB: 20-Apr-1971 DOA: 05/15/2016  PCP: Temple PaciniBONSU, OSEI A, DO   Brief Narrative:  45 y.o. female with PMHx of ESRD who [facility] by nephrology for evaluation after unsuccessful attempt to declot right subclavian HD catheter. Pt was found to have pulmonary embolism in right lower lobe, and superior vena cava thromboses. Started on IV Heparin and admitted for further evaluation and treatment. She had catheter exchanged as OP 6/12. Warfarin started 6/15.   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   Hyperkalemia: now improved, getting HD.  ESRD / Hyperkalemia - On HD TTS HD - Potassium WNL  Chronic Hypotension - Continue Midodrine  Anemia of chronic disease - Stable Hgb, 9.3 this am  Hypothyroidism - Continue Levothyroxine.  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 No evidence of superfcial thrombus in left arm. Two stent in the upper arm  Antimicrobials:   None   Subjective: Pt without complaints.   Objective: Filed Vitals:   05/18/16 0440 05/18/16 1000 05/18/16 1958 05/19/16 0508  BP: 88/56 88/52 70/57  117/59  Pulse:  47 98 95 80  Temp: 97.8 F (36.6 C) 98.1 F (36.7 C) 98.2 F (36.8 C) 97.5 F (36.4 C)  TempSrc: Oral Oral Oral Oral  Resp: 20  20 18   Height:      Weight:      SpO2: 98% 94% 96% 100%    Intake/Output Summary (Last 24 hours) at 05/19/16 0748 Last data filed at 05/19/16 0612  Gross per 24 hour  Intake    240 ml  Output      0 ml  Net    240 ml   Filed Weights   05/16/16 1250 05/16/16 1643 05/17/16 0758  Weight: 309 lb 11.9 oz (140.5 kg) 303 lb 5.7 oz (137.6 kg) 305 lb 12.5 oz (138.7 kg)    Examination:  General exam: Appears calm and comfortable  Respiratory system: Clear to auscultation. Respiratory effort normal. Cardiovascular system: S1 & S2 heard, RRR.  Gastrointestinal system: Abdomen is nondistended, soft and nontender.  Central nervous system: Alert and oriented. No focal neurological deficits. Extremities: right AKA, left extremity palpable pulses  Skin: No rashes, lesions or ulcers Psychiatry: Judgement and insight appear normal. Mood & affect appropriate.   Data Reviewed: I have personally reviewed following labs and imaging studies  CBC:  Recent Labs Lab 05/15/16 1810 05/17/16 0502 05/18/16 0656  WBC 10.4 9.1 8.3  HGB 10.5* 10.7* 9.3*  HCT 34.4* 34.1* 30.1*  MCV 91.7 90.2 89.6  PLT 218 216 216   Basic Metabolic Panel:  Recent Labs Lab 05/12/16 1527 05/15/16  1810 05/16/16 0142 05/16/16 0900 05/16/16 1345 05/17/16 0502  NA 135 137  --  135  --  135  K 5.2* 6.1* 5.1 5.7*  --  4.3  CL 96* 100*  --  97*  --  96*  CO2 25 23  --  23  --  24  GLUCOSE 112* 75  --  122*  --  96  BUN 60* 68*  --  72*  --  51*  CREATININE 13.83* 16.63*  --  17.47*  --  13.25*  CALCIUM 8.7* 8.9  --  9.1  --  8.9  MG  --   --   --   --  2.3  --   PHOS  --   --   --   --  9.4* 8.5*   GFR: Estimated Creatinine Clearance: 8.4 mL/min (by C-G formula based on Cr of 13.25). Liver Function Tests:  Recent Labs Lab 05/17/16 0502  ALBUMIN 3.1*   No results for  input(s): LIPASE, AMYLASE in the last 168 hours. No results for input(s): AMMONIA in the last 168 hours. Coagulation Profile: No results for input(s): INR, PROTIME in the last 168 hours. Cardiac Enzymes: No results for input(s): CKTOTAL, CKMB, CKMBINDEX, TROPONINI in the last 168 hours. BNP (last 3 results) No results for input(s): PROBNP in the last 8760 hours. HbA1C: No results for input(s): HGBA1C in the last 72 hours. CBG:  Recent Labs Lab 05/17/16 2014 05/18/16 0731 05/18/16 1112 05/18/16 1620 05/18/16 1956  GLUCAP 114* 92 123* 105* 118*   Lipid Profile: No results for input(s): CHOL, HDL, LDLCALC, TRIG, CHOLHDL, LDLDIRECT in the last 72 hours. Thyroid Function Tests: No results for input(s): TSH, T4TOTAL, FREET4, T3FREE, THYROIDAB in the last 72 hours. Anemia Panel: No results for input(s): VITAMINB12, FOLATE, FERRITIN, TIBC, IRON, RETICCTPCT in the last 72 hours. Urine analysis: No results found for: COLORURINE, APPEARANCEUR, LABSPEC, PHURINE, GLUCOSEU, HGBUR, BILIRUBINUR, KETONESUR, PROTEINUR, UROBILINOGEN, NITRITE, LEUKOCYTESUR Sepsis Labs: (procalcitonin:4,lacticidven:4)   Recent Results (from the past 240 hour(s))  Rapid strep screen     Status: None   Collection Time: 05/09/16  5:25 PM  Result Value Ref Range Status   Streptococcus, Group A Screen (Direct) NEGATIVE NEGATIVE Final  Culture, group A strep     Status: None   Collection Time: 05/09/16  5:25 PM  Result Value Ref Range Status   Specimen Description THROAT  Final   Special Requests NONE Reflexed from [id number]  Final   Culture   Final    NO GROUP A STREP (S.PYOGENES) ISOLATED Performed at [facility]    Report Status 05/12/2016 FINAL  Final  MRSA PCR Screening     Status: None   Collection Time: 05/17/16  9:15 PM  Result Value Ref Range Status   MRSA by PCR NEGATIVE NEGATIVE Final      Radiology Studies: Dg Chest 2 View 05/15/2016  Asymmetric right lower lobe atelectasis or  infiltrate. The tip of the dialysis catheter is incompletely visualized.   Ct Angio Chest Pe W/cm &/or Wo Cm 05/16/2016   Acute RIGHT lower lobe nonocclusive pulmonary embolism. RIGHT lower lobe atelectasis and possible infarct though, less likely. 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 Dialysis catheter via RIGHT internal jugular venous approach, distal tip in inferior vena cava.    Scheduled Meds: . cinacalcet  30 mg Oral Q breakfast  . DULoxetine  90 mg Oral Daily  . gabapentin  300 mg Oral TID  . levothyroxine  88 mcg Oral QAC breakfast  . midodrine  10 mg Oral BID WC  . mirtazapine  7.5 mg Oral QHS  . sevelamer carbonate  800 mg Oral TID WC  . Warfarin - Pharmacist Dosing Inpatient   Does not apply q1800   Continuous Infusions: . heparin 1,950 Units/hr (05/18/16 1208)     LOS: 4 days   Time spent: 27 minutes Greater than 50% of the time spent on counseling and coordinating the care.  Standley Dakins, MD [facility] Pager [phone]  If 7PM-7AM, please contact night-coverage [email or web address] Password [facility] 05/19/2016, 7:48 AM

## 2016-05-20 LAB — CBC
HCT: 32.7 % — ABNORMAL LOW (ref 36.0–46.0)
HEMOGLOBIN: 9.8 g/dL — AB (ref 12.0–15.0)
MCH: 27.8 pg (ref 26.0–34.0)
MCHC: 30 g/dL (ref 30.0–36.0)
MCV: 92.6 fL (ref 78.0–100.0)
PLATELETS: 286 10*3/uL (ref 150–400)
RBC: 3.53 MIL/uL — ABNORMAL LOW (ref 3.87–5.11)
RDW: 18 % — AB (ref 11.5–15.5)
WBC: 8.7 10*3/uL (ref 4.0–10.5)

## 2016-05-20 LAB — PROTIME-INR
INR: 1.13 (ref 0.00–1.49)
INR: 1.12 (ref 0.00–1.49)
PROTHROMBIN TIME: 14.7 s (ref 11.6–15.2)
PROTHROMBIN TIME: 14.6 s (ref 11.6–15.2)

## 2016-05-20 LAB — HEPARIN LEVEL (UNFRACTIONATED)
HEPARIN UNFRACTIONATED: 0.29 [IU]/mL — AB (ref 0.30–0.70)
HEPARIN UNFRACTIONATED: 0.41 [IU]/mL (ref 0.30–0.70)

## 2016-05-20 MED ORDER — WARFARIN SODIUM 7.5 MG PO TABS
15.0000 mg | ORAL_TABLET | Freq: Once | ORAL | Status: AC
  Administered 2016-05-20: 15 mg via ORAL
  Filled 2016-05-20: qty 2

## 2016-05-20 MED ORDER — PRO-STAT SUGAR FREE PO LIQD
30.0000 mL | Freq: Two times a day (BID) | ORAL | Status: DC
  Administered 2016-05-20 – 2016-05-21 (×4): 30 mL via ORAL
  Filled 2016-05-20 (×5): qty 30

## 2016-05-20 MED ORDER — HYDROCODONE-ACETAMINOPHEN 5-325 MG PO TABS
1.0000 | ORAL_TABLET | Freq: Once | ORAL | Status: AC
  Administered 2016-05-20: 1 via ORAL
  Filled 2016-05-20: qty 1

## 2016-05-20 NOTE — Progress Notes (Signed)
Christina Walters Progress Note  Assessment/Plan: 1. Right atrium/SVC clot with SVC syndrome - cath related - on heparin/coumadin started - need to decide who will managed after d/c; evaluated by IR - clot adherent to catheter and nonocclusive. INR not up yet, sp 2 doses coumadin so far, 3rd tonight.  2. ESRD - MWF - last HD 6/14 - plan HD today K high on admission 6.1; multiple failed access due to clotting - she is not aware if she has ever had a hypercoagulable panel - next HD Monday 3. Anemia - hgb dropping 9.3 pre HD Friday> 9.8 Sat  - started ESA Aranesp 40 4. Secondary hyperparathyroidism - P elevated ^ renvela to 3 ac tid; resume Hectorol 10 5. BP/volume - no post HD wt 6/14 net UF 2.5 net UF 3 L Friday with post weight 139.6 - still above EDW - BP ok - may be scale issues given size- try for more volume on Monday 6. Nutrition - changed to renal diet due to high K content of heart healthy diet/vit alb 2.6 - added prostat  Sheffield Slider, PA-C [facility] Beeper [phone] 05/20/2016,9:49 AM  LOS: 5 days   Pt seen, examined and agree w A/P as above.  Vinson Moselle MD [facility] pager [phone]    cell [phone] 05/20/2016, 1:43 PM    Subjective:   No c/o except wanted to know why changed from heart healthy diet  Objective Filed Vitals:   05/19/16 2100 05/19/16 2208 05/20/16 0614 05/20/16 0912  BP: 106/74 104/55 108/38 120/56  Pulse: 99 97 106 97  Temp: 98.2 F (36.8 C) 98.5 F (36.9 C) 98.6 F (37 C) 98.6 F (37 C)  TempSrc: Oral Oral Oral Oral  Resp: Height:      Weight: 139.6 kg (307 lb 12.2 oz) 138.7 kg (305 lb 12.5 oz)    SpO2:    95%   Physical Exam General: NAD morbidly obese, looks well Heart: RRR Lungs: no rales Abdomen: soft obese Extremities: left BKA and RLE no sig edema Dialysis Access: right IJ  Dialysis Orders:  MWF [facility] 5h 132.5kg 2/2.25 bath Hep 12,000 R IJ cath Hect 10  ug Additional Objective Labs: Lab Results  Component Value Date   INR 1.13 05/20/2016   INR 1.15 05/19/2016     Basic Metabolic Panel:  Recent Labs Lab 05/16/16 0900 05/16/16 1345 05/17/16 0502 05/19/16 1600  NA 135  --  135 134*  K 5.7*  --  4.3 4.5  CL 97*  --  96* 99*  CO2 23  --  24 24  GLUCOSE 122*  --  96 85  BUN 72*  --  51* 41*  CREATININE 17.47*  --  13.25* 10.89*  CALCIUM 9.1  --  8.9 8.4*  PHOS  --  9.4* 8.5* 5.3*   Liver Function Tests:  Recent Labs Lab 05/17/16 0502 05/19/16 1600  ALBUMIN 3.1* 2.8*   CBC:  Recent Labs Lab 05/17/16 0502 05/18/16 0656 05/19/16 0753 05/19/16 1600 05/20/16 0540  WBC 9.1 8.3 8.5 9.6 8.7  HGB 10.7* 9.3* 9.3* 9.4* 9.8*  HCT 34.1* 30.1* 31.0* 30.9* 32.7*  MCV 90.2 89.6 90.9 90.6 92.6  PLT 216 216 237 285 286   CBG:  Recent Labs Lab 05/17/16 2014 05/18/16 0731 05/18/16 1112 05/18/16 1620 05/18/16 1956  GLUCAP 114* 92 123* 105* 118*   Medications: . heparin 2,150 Units/hr (05/20/16 0637)   . cinacalcet  30  mg Oral Q breakfast  . darbepoetin (ARANESP) injection - DIALYSIS  40 mcg Intravenous Q Fri-HD  . DULoxetine  90 mg Oral Daily  . gabapentin  300 mg Oral TID  . levothyroxine  88 mcg Oral QAC breakfast  . midodrine  10 mg Oral BID WC  . mirtazapine  7.5 mg Oral QHS  . multivitamin  1 tablet Oral QHS  . sevelamer carbonate  2,400 mg Oral TID WC  . warfarin  15 mg Oral ONCE-1800  . Warfarin - Pharmacist Dosing Inpatient   Does not apply [phone]

## 2016-05-20 NOTE — Progress Notes (Signed)
ANTICOAGULATION CONSULT NOTE  Pharmacy Consult for Heparin and warfarin  Indication: VTE  Allergies  Allergen Reactions  . Penicillins Hives and Itching    Has patient had a PCN reaction causing immediate rash, facial/tongue/throat swelling, SOB or lightheadedness with hypotension: Yes Has patient had a PCN reaction causing severe rash involving mucus membranes or skin necrosis: No Has patient had a PCN reaction that required hospitalization No Has patient had a PCN reaction occurring within the last 10 years: No If all of the above answers are "NO", then may proceed with Cephalosporin use.   . Sulfa Antibiotics Hives and Itching  . Sulfamethoxazole-Trimethoprim Hives and Itching    Patient Measurements: Height: 6' (182.9 cm) Weight: (!) 305 lb 12.5 oz (138.7 kg) IBW/kg (Calculated) : 73.1 Heparin Dosing Weight: 104 kg  Vital Signs: Temp: 98.6 F (37 C) (06/17 0614) Temp Source: Oral (06/17 0614) BP: 108/38 mmHg (06/17 0614) Pulse Rate: 106 (06/17 0614)  Labs:  Recent Labs  05/18/16 1810 05/19/16 0753 05/19/16 1600 05/20/16 0540  HGB  --  9.3* 9.4* 9.8*  HCT  --  31.0* 30.9* 32.7*  PLT  --  237 285 286  LABPROT  --  14.9  --  14.7  INR  --  1.15  --  1.13  HEPARINUNFRC 0.40 0.48  --  0.29*  CREATININE  --   --  10.89*  --     Estimated Creatinine Clearance: 10.2 mL/min (by C-G formula based on Cr of 10.89).   Assessment: 45yo female on heparin and warfarin (started 6/15) for RLL nonocclusive PE. INR with no movement past 2 doses of coumadin 10mg . Heparin level now down to slightly subtherapeutic on 1950 units/hr. No issues with line or bleeding reported per RN.  Goal of Therapy:  Heparin level 0.3-0.7 units/ml Monitor platelets by anticoagulation protocol: Yes   Plan:  1. Increase heparin to 2150 units/hr 2. Warfarin 15 mg x 1 this evening  3. Will f/u 6 hr heparin level and daily INR  Christoper Fabianaron Nikoli Nasser, PharmD, BCPS Clinical pharmacist, pager  [phone] 05/20/2016, 6:33 AM

## 2016-05-20 NOTE — Progress Notes (Signed)
Patient ID: Christina Walters, female   DOB: Dec 26, 1970, 45 y.o.   MRN: [id number]  PROGRESS NOTE    Christina Walters  [id number]:[id number] DOB: Jul 29, 1971 DOA: 05/15/2016  PCP: Temple Pacini, DO   Brief Narrative:  45 y.o. female with PMHx of ESRD who presented to [facility] by nephrology for evaluation after unsuccessful attempt to declot right subclavian HD catheter. Pt was found to have pulmonary embolism in right lower lobe, and superior vena cava thromboses. Started on IV Heparin and admitted for further evaluation and treatment. She had catheter exchanged as OP 6/12. Warfarin started 6/15.   Assessment & Plan:   Acute Pulmonary Embolism: - Continue heparin drip and and oral warfarin.  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  Following daily PT/INR.  Hyperkalemia: now improved, getting HD.  ESRD / Hyperkalemia - On HD TTS HD - Potassium WNL  Chronic Hypotension - Continue Midodrine  Anemia of chronic disease - Stable Hgb, 9.8   Hypothyroidism - Continue Levothyroxine.  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 No evidence of superfcial thrombus in left arm. Two stent in the upper arm  Antimicrobials:   None   Subjective: Pt without complaints.   Objective: Filed Vitals:   05/19/16 2100 05/19/16 2208 05/20/16 0614 05/20/16 0912  BP: 106/74 104/55 108/38  120/56  Pulse: 99 97 106 97  Temp: 98.2 F (36.8 C) 98.5 F (36.9 C) 98.6 F (37 C) 98.6 F (37 C)  TempSrc: Oral Oral Oral Oral  Resp: 16 16 16 18   Height:      Weight: 307 lb 12.2 oz (139.6 kg) 305 lb 12.5 oz (138.7 kg)    SpO2:    95%    Intake/Output Summary (Last 24 hours) at 05/20/16 0957 Last data filed at 05/20/16 0900  Gross per 24 hour  Intake  247.6 ml  Output   3000 ml  Net -2752.4 ml   Filed Weights   05/19/16 1552 05/19/16 2100 05/19/16 2208  Weight: 313 lb 11.4 oz (142.3 kg) 307 lb 12.2 oz (139.6 kg) 305 lb 12.5 oz (138.7 kg)    Examination:  General exam: Appears calm and comfortable  Respiratory system: Clear to auscultation. Respiratory effort normal. Cardiovascular system: S1 & S2 heard, RRR.  Gastrointestinal system: Abdomen is nondistended, soft and nontender.  Central nervous system: Alert and oriented. No focal neurological deficits. Extremities: right AKA, left extremity palpable pulses  Skin: No rashes, lesions or ulcers Psychiatry: Judgement and insight appear normal. Mood & affect appropriate.   Data Reviewed: I have personally reviewed following labs and imaging studies  CBC:  Recent Labs Lab 05/17/16 0502 05/18/16 0656 05/19/16 0753 05/19/16 1600 05/20/16 0540  WBC 9.1 8.3 8.5 9.6 8.7  HGB 10.7* 9.3* 9.3* 9.4* 9.8*  HCT 34.1* 30.1* 31.0* 30.9* 32.7*  MCV 90.2 89.6 90.9 90.6 92.6  PLT 216 216 237 285 286   Basic Metabolic Panel:  Recent Labs Lab 05/15/16 1810 05/16/16 0142 05/16/16 0900 05/16/16 1345 05/17/16 0502 05/19/16 1600  NA 137  --  135  --  135 134*  K 6.1* 5.1 5.7*  --  4.3 4.5  CL 100*  --  97*  --  96* 99*  CO2 23  --  23  --  24 24  GLUCOSE 75  --  122*  --  96 85  BUN 68*  --  72*  --  51* 41*  CREATININE 16.63*  --  17.47*  --  13.25* 10.89*  CALCIUM 8.9  --  9.1  --  8.9 8.4*  MG  --   --   --  2.3  --   --   PHOS  --   --   --  9.4* 8.5* 5.3*   GFR: Estimated Creatinine Clearance: 10.2 mL/min (by  C-G formula based on Cr of 10.89). Liver Function Tests:  Recent Labs Lab 05/17/16 0502 05/19/16 1600  ALBUMIN 3.1* 2.8*   No results for input(s): LIPASE, AMYLASE in the last 168 hours. No results for input(s): AMMONIA in the last 168 hours. Coagulation Profile:  Recent Labs Lab 05/19/16 0753 05/20/16 0540  INR 1.15 1.13   Cardiac Enzymes: No results for input(s): CKTOTAL, CKMB, CKMBINDEX, TROPONINI in the last 168 hours. BNP (last 3 results) No results for input(s): PROBNP in the last 8760 hours. HbA1C: No results for input(s): HGBA1C in the last 72 hours. CBG:  Recent Labs Lab 05/17/16 2014 05/18/16 0731 05/18/16 1112 05/18/16 1620 05/18/16 1956  GLUCAP 114* 92 123* 105* 118*   Lipid Profile: No results for input(s): CHOL, HDL, LDLCALC, TRIG, CHOLHDL, LDLDIRECT in the last 72 hours. Thyroid Function Tests: No results for input(s): TSH, T4TOTAL, FREET4, T3FREE, THYROIDAB in the last 72 hours. Anemia Panel: No results for input(s): VITAMINB12, FOLATE, FERRITIN, TIBC, IRON, RETICCTPCT in the last 72 hours. Urine analysis: No results found for: COLORURINE, APPEARANCEUR, LABSPEC, PHURINE, GLUCOSEU, HGBUR, BILIRUBINUR, KETONESUR, PROTEINUR, UROBILINOGEN, NITRITE, LEUKOCYTESUR Sepsis Labs: @LABRCNTIP (procalcitonin:4,lacticidven:4)   Recent Results (from the past 240 hour(s))  Rapid strep screen     Status: None   Collection Time: 05/09/16  5:25 PM  Result Value Ref Range Status   Streptococcus, Group A Screen (Direct) NEGATIVE NEGATIVE Final  Culture, group A strep     Status: None   Collection Time: 05/09/16  5:25 PM  Result Value Ref Range Status   Specimen Description THROAT  Final   Special Requests NONE Reflexed from [id number]  Final   Culture   Final    NO GROUP A STREP (S.PYOGENES) ISOLATED [facility]    Report Status 05/12/2016 FINAL  Final  MRSA PCR Screening     Status: None   Collection Time: 05/17/16  9:15 PM  Result Value  Ref Range Status   MRSA by PCR NEGATIVE NEGATIVE Final      Radiology Studies: Dg Chest 2 View 05/15/2016  Asymmetric right lower lobe atelectasis or infiltrate. The tip of the dialysis catheter is incompletely visualized.   Ct Angio Chest Pe W/cm &/or Wo Cm 05/16/2016   Acute RIGHT lower lobe nonocclusive pulmonary embolism. RIGHT lower lobe atelectasis and possible infarct though, less likely. 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 Dialysis catheter via RIGHT internal jugular venous approach, distal tip in inferior vena cava.    Scheduled Meds: . cinacalcet  30 mg Oral  Q breakfast  . darbepoetin (ARANESP) injection - DIALYSIS  40 mcg Intravenous Q Fri-HD  . DULoxetine  90 mg Oral Daily  . feeding supplement (PRO-STAT SUGAR FREE 64)  30 mL Oral BID  . gabapentin  300 mg Oral TID  . levothyroxine  88 mcg Oral QAC breakfast  . midodrine  10 mg Oral BID WC  . mirtazapine  7.5 mg Oral QHS  . multivitamin  1 tablet Oral QHS  . sevelamer carbonate  2,400 mg Oral TID WC  . warfarin  15 mg Oral ONCE-1800  . Warfarin - Pharmacist Dosing Inpatient   Does not apply q1800   Continuous Infusions: . heparin 2,150 Units/hr (05/20/16 0637)    LOS: 5 days   Time spent: 15 minutes Greater than 50% of the time spent on counseling and coordinating the care.  Standley Dakins, MD [facility] Pager [phone]  If 7PM-7AM, please contact night-coverage [email or web address] Password [facility] 05/20/2016, 9:57 AM

## 2016-05-20 NOTE — Progress Notes (Signed)
ANTICOAGULATION CONSULT NOTE  Pharmacy Consult for Heparin and warfarin  Indication: VTE  Allergies  Allergen Reactions  . Penicillins Hives and Itching    Has patient had a PCN reaction causing immediate rash, facial/tongue/throat swelling, SOB or lightheadedness with hypotension: Yes Has patient had a PCN reaction causing severe rash involving mucus membranes or skin necrosis: No Has patient had a PCN reaction that required hospitalization No Has patient had a PCN reaction occurring within the last 10 years: No If all of the above answers are "NO", then may proceed with Cephalosporin use.   . Sulfa Antibiotics Hives and Itching  . Sulfamethoxazole-Trimethoprim Hives and Itching    Patient Measurements: Height: 6' (182.9 cm) Weight: (!) 305 lb 12.5 oz (138.7 kg) IBW/kg (Calculated) : 73.1 Heparin Dosing Weight: 104 kg  Vital Signs: Temp: 98.6 F (37 C) (06/17 0912) Temp Source: Oral (06/17 0912) BP: 120/56 mmHg (06/17 0912) Pulse Rate: 97 (06/17 0912)  Labs:  Recent Labs  05/19/16 0753 05/19/16 1600 05/20/16 0540 05/20/16 1234  HGB 9.3* 9.4* 9.8*  --   HCT 31.0* 30.9* 32.7*  --   PLT 237 285 286  --   LABPROT 14.9  --  14.6  14.7  --   INR 1.15  --  1.12  1.13  --   HEPARINUNFRC 0.48  --  0.29* 0.41  CREATININE  --  10.89*  --   --     Estimated Creatinine Clearance: 10.2 mL/min (by C-G formula based on Cr of 10.89).   Assessment: 45yo female on heparin and warfarin (started 6/15) for RLL nonocclusive PE. INR with no movement past 2 doses of coumadin 10mg . Heparin level back to therapeutic on 2150 units/hr.   Goal of Therapy:  Heparin level 0.3-0.7 units/ml Monitor platelets by anticoagulation protocol: Yes   Plan:  1. Continue heparin at 2150 units/hr 2. Warfarin 15 mg x 1 this evening  3. F/u HL in AM  Greggory Stallionristy Reyes, PharmD Clinical Pharmacy Resident Pager # [phone] 05/20/2016 3:45 PM

## 2016-05-21 LAB — CBC
HEMATOCRIT: 34.3 % — AB (ref 36.0–46.0)
HEMOGLOBIN: 10 g/dL — AB (ref 12.0–15.0)
MCH: 27.3 pg (ref 26.0–34.0)
MCHC: 29.2 g/dL — ABNORMAL LOW (ref 30.0–36.0)
MCV: 93.7 fL (ref 78.0–100.0)
Platelets: 293 10*3/uL (ref 150–400)
RBC: 3.66 MIL/uL — AB (ref 3.87–5.11)
RDW: 18 % — AB (ref 11.5–15.5)
WBC: 10.3 10*3/uL (ref 4.0–10.5)

## 2016-05-21 LAB — PROTIME-INR
INR: 1.43 (ref 0.00–1.49)
PROTHROMBIN TIME: 17.5 s — AB (ref 11.6–15.2)

## 2016-05-21 LAB — HEPARIN LEVEL (UNFRACTIONATED): Heparin Unfractionated: 0.39 IU/mL (ref 0.30–0.70)

## 2016-05-21 MED ORDER — WARFARIN SODIUM 10 MG PO TABS
10.0000 mg | ORAL_TABLET | Freq: Once | ORAL | Status: AC
Start: 2016-05-21 — End: 2016-05-21
  Administered 2016-05-21: 10 mg via ORAL
  Filled 2016-05-21: qty 1

## 2016-05-21 NOTE — Discharge Instructions (Signed)
Information on my medicine - Coumadin   (Warfarin)  This medication education was reviewed with me or my healthcare representative as part of my discharge preparation.  The pharmacist that spoke with me during my hospital stay was:  Annamary CarolinCristina E Walters, Christina Walters  Why was Coumadin prescribed for you? Coumadin was prescribed for you because you have a blood clot or a medical condition that can cause an increased risk of forming blood clots. Blood clots can cause serious health problems by blocking the flow of blood to the heart, lung, or brain. Coumadin can prevent harmful blood clots from forming. As a reminder your indication for Coumadin is:   Pulmonary Embolism Treatment  What test will check on my response to Coumadin? While on Coumadin (warfarin) you will need to have an INR test regularly to ensure that your dose is keeping you in the desired range. The INR (international normalized ratio) number is calculated from the result of the laboratory test called prothrombin time (PT).  If an INR APPOINTMENT HAS NOT ALREADY BEEN MADE FOR YOU please schedule an appointment to have this lab work done by your health care provider within 7 days. Your INR goal is usually a number between:  2 to 3 or your provider may give you a more narrow range like 2-2.5.  Ask your health care provider during an office visit what your goal INR is.  What  do you need to  know  About  COUMADIN? Take Coumadin (warfarin) exactly as prescribed by your healthcare provider about the same time each day.  DO NOT stop taking without talking to the doctor who prescribed the medication.  Stopping without other blood clot prevention medication to take the place of Coumadin may increase your risk of developing a new clot or stroke.  Get refills before you run out.  What do you do if you miss a dose? If you miss a dose, take it as soon as you remember on the same day then continue your regularly scheduled regimen the next day.  Do not take  two doses of Coumadin at the same time.  Important Safety Information A possible side effect of Coumadin (Warfarin) is an increased risk of bleeding. You should call your healthcare provider right away if you experience any of the following: ? Bleeding from an injury or your nose that does not stop. ? Unusual colored urine (red or dark brown) or unusual colored stools (red or black). ? Unusual bruising for unknown reasons. ? A serious fall or if you hit your head (even if there is no bleeding).  Some foods or medicines interact with Coumadin (warfarin) and might alter your response to warfarin. To help avoid this: ? Eat a balanced diet, maintaining a consistent amount of Vitamin K. ? Notify your provider about major diet changes you plan to make. ? Avoid alcohol or limit your intake to 1 drink for women and 2 drinks for men per day. (1 drink is 5 oz. wine, 12 oz. beer, or 1.5 oz. liquor.)  Make sure that ANY health care provider who prescribes medication for you knows that you are taking Coumadin (warfarin).  Also make sure the healthcare provider who is monitoring your Coumadin knows when you have started a new medication including herbals and non-prescription products.  Coumadin (Warfarin)  Major Drug Interactions  Increased Warfarin Effect Decreased Warfarin Effect  Alcohol (large quantities) Antibiotics (esp. Septra/Bactrim, Flagyl, Cipro) Amiodarone (Cordarone) Aspirin (ASA) Cimetidine (Tagamet) Megestrol (Megace) NSAIDs (ibuprofen, naproxen, etc.) Piroxicam (  Feldene) °Propafenone (Rythmol SR) °Propranolol (Inderal) °Isoniazid (INH) °Posaconazole (Noxafil) Barbiturates (Phenobarbital) °Carbamazepine (Tegretol) °Chlordiazepoxide (Librium) °Cholestyramine (Questran) °Griseofulvin °Oral Contraceptives °Rifampin °Sucralfate (Carafate) °Vitamin K  ° °Coumadin® (Warfarin) Major Herbal Interactions  °Increased Warfarin Effect Decreased Warfarin Effect  °Garlic °Ginseng °Ginkgo biloba  Coenzyme Q10 °Green tea °St. John’s wort   ° °Coumadin® (Warfarin) FOOD Interactions  °Eat a consistent number of servings per week of foods HIGH in Vitamin K °(1 serving = ½ cup)  °Collards (cooked, or boiled & drained) °Kale (cooked, or boiled & drained) °Mustard greens (cooked, or boiled & drained) °Parsley *serving size only = ¼ cup °Spinach (cooked, or boiled & drained) °Swiss chard (cooked, or boiled & drained) °Turnip greens (cooked, or boiled & drained)  °Eat a consistent number of servings per week of foods MEDIUM-HIGH in Vitamin K °(1 serving = 1 cup)  °Asparagus (cooked, or boiled & drained) °Broccoli (cooked, boiled & drained, or raw & chopped) °Brussel sprouts (cooked, or boiled & drained) *serving size only = ½ cup °Lettuce, raw (green leaf, endive, romaine) °Spinach, raw °Turnip greens, raw & chopped  ° °These websites have more information on Coumadin (warfarin):  www.coumadin.com; °www.ahrq.gov/consumer/coumadin.htm; ° ° °

## 2016-05-21 NOTE — Progress Notes (Signed)
Patient ID: Christina Walters, female   DOB: 1971/07/02, 45 y.o.   MRN: [id number]  PROGRESS NOTE    Christina Walters  LKG:[id number] DOB: 1971/07/02 DOA: 05/15/2016  PCP: Temple PaciniBONSU, OSEI A, DO   Brief Narrative:  45 y.o. female with PMHx of ESRD who [facility] for evaluation after unsuccessful attempt to declot right subclavian HD catheter. Pt was found to have pulmonary embolism in right lower lobe, and superior vena cava thromboses. Started on IV Heparin and admitted for further evaluation and treatment. She had catheter exchanged as OP 6/12. Warfarin started 6/15.   Assessment & Plan:   Acute Pulmonary Embolism: - Continue heparin drip and and oral warfarin.  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  Following daily PT/INR. Approaching target.   Hyperkalemia: now improved on HD.  ESRD / Hyperkalemia - On HD TTS HD - Potassium WNL   Chronic Hypotension - Continue Midodrine  Anemia of chronic disease - Stable Hgb, 10  Hypothyroidism - Continue Levothyroxine.  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 No evidence of superfcial thrombus in left arm. Two stent in the upper arm  Antimicrobials:   None   Subjective: Pt without complaints.   Objective: Filed Vitals:   05/20/16 1700 05/20/16 2000 05/21/16 0422 05/21/16 0758  BP: 117/66 111/60 120/64 108/63  Pulse: 96 88 94 93  Temp: 98.5 F (36.9 C) 97.8 F (36.6 C) 98.4 F (36.9 C) 98.4 F (36.9 C)  TempSrc: Oral Oral Oral Oral  Resp: 18 19 20 18   Height:      Weight:      SpO2: 99% 98% 100% 95%    Intake/Output Summary (Last 24 hours) at 05/21/16 0831 Last data filed at 05/21/16 [id number]  Gross per 24 hour  Intake 1435.99 ml  Output      0 ml  Net 1435.99 ml   Filed Weights   05/19/16 1552 05/19/16 2100 05/19/16 2208  Weight: 313 lb 11.4 oz (142.3 kg) 307 lb 12.2 oz (139.6 kg) 305 lb 12.5 oz (138.7 kg)    Examination:  General exam: Appears calm and comfortable  Respiratory system: Clear to auscultation. Respiratory effort normal. Cardiovascular system: S1 & S2 heard, RRR.  Gastrointestinal system: Abdomen is nondistended, soft and nontender.  Central nervous system: Alert and oriented. No focal neurological deficits. Extremities: right AKA, left extremity palpable pulses  Skin: No rashes, lesions or ulcers Psychiatry: Judgement and insight appear normal. Mood & affect appropriate.   Data Reviewed: I have personally reviewed following labs and imaging studies  CBC:  Recent Labs Lab 05/18/16 0656 05/19/16 0753 05/19/16 1600 05/20/16 0540 05/21/16 0417  WBC 8.3 8.5 9.6 8.7 10.3  HGB 9.3* 9.3* 9.4* 9.8* 10.0*  HCT 30.1* 31.0* 30.9* 32.7* 34.3*  MCV 89.6 90.9 90.6 92.6 93.7  PLT 216 237 285 286 293   Basic Metabolic Panel:  Recent Labs Lab 05/15/16 1810 05/16/16 0142 05/16/16 0900 05/16/16 1345 05/17/16 0502 05/19/16 1600  NA 137  --  135  --  135 134*  K 6.1* 5.1 5.7*  --  4.3 4.5  CL 100*  --  97*  --  96* 99*  CO2 23  --  23  --  24 24  GLUCOSE 75  --  122*  --  96 85  BUN 68*  --  72*  --  51* 41*  CREATININE 16.63*  --  17.47*  --  13.25* 10.89*  CALCIUM 8.9  --  9.1  --  8.9 8.4*  MG  --   --   --  2.3  --   --   PHOS  --   --   --  9.4* 8.5* 5.3*    GFR: Estimated Creatinine Clearance: 10.2 mL/min (by C-G formula based on Cr of 10.89). Liver Function Tests:  Recent Labs Lab 05/17/16 0502 05/19/16 1600  ALBUMIN 3.1* 2.8*   No results for input(s): LIPASE, AMYLASE in the last 168 hours. No results for input(s): AMMONIA in the last 168 hours. Coagulation Profile:  Recent Labs Lab 05/19/16 0753 05/20/16 0540 05/21/16 0417  INR 1.15 1.12  1.13 1.43   Cardiac Enzymes: No results for input(s): CKTOTAL, CKMB, CKMBINDEX, TROPONINI in the last 168 hours. BNP (last 3 results) No results for input(s): PROBNP in the last 8760 hours. HbA1C: No results for input(s): HGBA1C in the last 72 hours. CBG:  Recent Labs Lab 05/17/16 2014 05/18/16 0731 05/18/16 1112 05/18/16 1620 05/18/16 1956  GLUCAP 114* 92 123* 105* 118*   Lipid Profile: No results for input(s): CHOL, HDL, LDLCALC, TRIG, CHOLHDL, LDLDIRECT in the last 72 hours. Thyroid Function Tests: No results for input(s): TSH, T4TOTAL, FREET4, T3FREE, THYROIDAB in the last 72 hours. Anemia Panel: No results for input(s): VITAMINB12, FOLATE, FERRITIN, TIBC, IRON, RETICCTPCT in the last 72 hours. Urine analysis: No results found for: COLORURINE, APPEARANCEUR, LABSPEC, PHURINE, GLUCOSEU, HGBUR, BILIRUBINUR, KETONESUR, PROTEINUR, UROBILINOGEN, NITRITE, LEUKOCYTESUR Sepsis Labs: (procalcitonin:4,lacticidven:4)   Recent Results (from the past 240 hour(s))  Rapid strep screen     Status: None   Collection Time: 05/09/16  5:25 PM  Result Value Ref Range Status   Streptococcus, Group A Screen (Direct) NEGATIVE NEGATIVE Final  Culture, group A strep     Status: None   Collection Time: 05/09/16  5:25 PM  Result Value Ref Range Status   Specimen Description THROAT  Final   Special Requests NONE Reflexed from [id number]  Final   Culture   Final    NO GROUP A STREP (S.PYOGENES) ISOLATED Performed at [facility]    Report Status 05/12/2016 FINAL  Final  MRSA  PCR Screening     Status: None   Collection Time: 05/17/16  9:15 PM  Result Value Ref Range Status   MRSA by PCR NEGATIVE NEGATIVE Final      Radiology Studies: Dg Chest 2 View 05/15/2016  Asymmetric right lower lobe atelectasis or infiltrate. The tip of the dialysis catheter is incompletely visualized.   Ct Angio Chest Pe W/cm &/or Wo Cm 05/16/2016   Acute RIGHT lower lobe nonocclusive pulmonary embolism. RIGHT lower lobe atelectasis and possible infarct though, less likely. 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 Dialysis catheter via RIGHT internal jugular venous approach, distal tip in inferior vena cava.    Scheduled Meds: .  cinacalcet  30 mg Oral Q breakfast  . darbepoetin (ARANESP) injection - DIALYSIS  40 mcg Intravenous Q Fri-HD  . DULoxetine  90 mg Oral Daily  . feeding supplement (PRO-STAT SUGAR FREE 64)  30 mL Oral BID  . gabapentin  300 mg Oral TID  . levothyroxine  88 mcg Oral QAC breakfast  . midodrine  10 mg Oral BID WC  . mirtazapine  7.5 mg Oral QHS  . multivitamin  1 tablet Oral QHS  . sevelamer carbonate  2,400 mg Oral TID WC  . warfarin  10 mg Oral ONCE-1800  . Warfarin - Pharmacist Dosing Inpatient   Does not apply q1800   Continuous Infusions: . heparin 2,150 Units/hr (05/21/16 0334)    LOS: 6 days   Time spent: 15 minutes Greater than 50% of the time spent on counseling and coordinating the care.  Standley Dakins, MD [facility] Pager [phone]  If 7PM-7AM, please contact night-coverage [email or web address] Password [facility] 05/21/2016, 8:31 AM

## 2016-05-21 NOTE — Progress Notes (Signed)
[facility] Progress Note  Assessment/Plan: 1. Right atrium/SVC syndrome w SVC clot - cath related - on heparin/coumadin started - need to decide who will managed after d/c; evaluated by IR - clot adherent to catheter and nonocclusive. INR starting to rise.  2. ESRD - MWF - last HD 6/14 - plan HD today K high on admission 6.1; multiple failed access due to clotting - she is not aware if she has ever had a hypercoagulable panel - next HD Monday 3. Anemia - hgb dropping 9.3 pre HD Friday> 9.8 Sat  - started ESA Aranesp 40 4. Secondary hyperparathyroidism - P elevated ^ renvela to 3 ac tid; resume Hectorol 10 5. BP/volume - up 5-6 kg by wts.  On midodrine for low bp's.   6. Nutrition - changed to renal diet due to high K content of heart healthy diet/vit alb 2.6 - added prostat  Vinson Moselleob Tavian Callander MD [facility] pager [phone]    cell [phone] 05/21/2016, 2:44 PM    Subjective:   No c/o.   Objective Filed Vitals:   05/20/16 1700 05/20/16 2000 05/21/16 0422 05/21/16 0758  BP: 117/66 111/60 120/64 108/63  Pulse: 96 88 94 93  Temp: 98.5 F (36.9 C) 97.8 F (36.6 C) 98.4 F (36.9 C) 98.4 F (36.9 C)  TempSrc: Oral Oral Oral Oral  Resp: 18 19 20 18   Height:      Weight:      SpO2: 99% 98% 100% 95%   Physical Exam General: NAD morbidly obese, looks well Heart: RRR Lungs: no rales Abdomen: soft obese Extremities: left BKA and RLE no sig edema Dialysis Access: right IJ  Dialysis Orders:  MWF [facility] 5h 132.5kg 2/2.25 bath Hep 12,000 R IJ cath Hect 10 ug Additional Objective Labs: Lab Results  Component Value Date   INR 1.43 05/21/2016   INR 1.12 05/20/2016   INR 1.13 05/20/2016     Basic Metabolic Panel:  Recent Labs Lab 05/16/16 0900 05/16/16 1345 05/17/16 0502 05/19/16 1600  NA 135  --  135 134*  K 5.7*  --  4.3 4.5  CL 97*  --  96* 99*  CO2 23  --  24 24  GLUCOSE 122*  --  96 85  BUN 72*  --  51* 41*  CREATININE  17.47*  --  13.25* 10.89*  CALCIUM 9.1  --  8.9 8.4*  PHOS  --  9.4* 8.5* 5.3*   Liver Function Tests:  Recent Labs Lab 05/17/16 0502 05/19/16 1600  ALBUMIN 3.1* 2.8*   CBC:  Recent Labs Lab 05/18/16 0656 05/19/16 0753 05/19/16 1600 05/20/16 0540 05/21/16 0417  WBC 8.3 8.5 9.6 8.7 10.3  HGB 9.3* 9.3* 9.4* 9.8* 10.0*  HCT 30.1* 31.0* 30.9* 32.7* 34.3*  MCV 89.6 90.9 90.6 92.6 93.7  PLT 216 237 285 286 293   CBG:  Recent Labs Lab 05/17/16 2014 05/18/16 0731 05/18/16 1112 05/18/16 1620 05/18/16 1956  GLUCAP 114* 92 123* 105* 118*   Medications: . heparin 2,150 Units/hr (05/21/16 0334)   . cinacalcet  30 mg Oral Q breakfast  . darbepoetin (ARANESP) injection - DIALYSIS  40 mcg Intravenous Q Fri-HD  . DULoxetine  90 mg Oral Daily  . feeding supplement (PRO-STAT SUGAR FREE 64)  30 mL Oral BID  . gabapentin  300 mg Oral TID  . levothyroxine  88 mcg Oral QAC breakfast  . midodrine  10 mg Oral BID WC  . mirtazapine  7.5 mg Oral QHS  .  multivitamin  1 tablet Oral QHS  . sevelamer carbonate  2,400 mg Oral TID WC  . warfarin  10 mg Oral ONCE-1800  . Warfarin - Pharmacist Dosing Inpatient   Does not apply [phone]

## 2016-05-21 NOTE — Progress Notes (Addendum)
ANTICOAGULATION CONSULT NOTE  Pharmacy Consult for Heparin and warfarin  Indication: VTE  Allergies  Allergen Reactions  . Penicillins Hives and Itching    Has patient had a PCN reaction causing immediate rash, facial/tongue/throat swelling, SOB or lightheadedness with hypotension: Yes Has patient had a PCN reaction causing severe rash involving mucus membranes or skin necrosis: No Has patient had a PCN reaction that required hospitalization No Has patient had a PCN reaction occurring within the last 10 years: No If all of the above answers are "NO", then may proceed with Cephalosporin use.   . Sulfa Antibiotics Hives and Itching  . Sulfamethoxazole-Trimethoprim Hives and Itching    Patient Measurements: Height: 6' (182.9 cm) Weight: (!) 305 lb 12.5 oz (138.7 kg) IBW/kg (Calculated) : 73.1 Heparin Dosing Weight: 104 kg  Vital Signs: Temp: 98.4 F (36.9 C) (06/18 0758) Temp Source: Oral (06/18 0758) BP: 108/63 mmHg (06/18 0758) Pulse Rate: 93 (06/18 0758)  Labs:  Recent Labs  05/19/16 0753 05/19/16 1600 05/20/16 0540 05/20/16 1234 05/21/16 0417  HGB 9.3* 9.4* 9.8*  --  10.0*  HCT 31.0* 30.9* 32.7*  --  34.3*  PLT 237 285 286  --  293  LABPROT 14.9  --  14.6  14.7  --  17.5*  INR 1.15  --  1.12  1.13  --  1.43  HEPARINUNFRC 0.48  --  0.29* 0.41 0.39  CREATININE  --  10.89*  --   --   --     Estimated Creatinine Clearance: 10.2 mL/min (by C-G formula based on Cr of 10.89).   Assessment: 45yo female on heparin and warfarin (started 6/15) for RLL nonocclusive PE. INR trend up but still subtherapeutic. Heparin level therapeutic x2 on 2150 units/hr. CBC stable. No bleeding reported,  Goal of Therapy:  Heparin level 0.3-0.7 units/ml Monitor platelets by anticoagulation protocol: Yes   Plan:  1. Continue heparin at 2150 units/hr 2. Warfarin 10 mg x 1 this evening  3. F/u HL in AM 4. Monitor for s/sx of bleeding  Greggory Stallionristy Reyes, PharmD Clinical Pharmacy  Resident Pager # [phone] 05/21/2016 8:04 AM

## 2016-05-22 DIAGNOSIS — T82868A Thrombosis of vascular prosthetic devices, implants and grafts, initial encounter: Principal | ICD-10-CM

## 2016-05-22 DIAGNOSIS — N186 End stage renal disease: Secondary | ICD-10-CM

## 2016-05-22 LAB — RENAL FUNCTION PANEL
ANION GAP: 10 (ref 5–15)
Albumin: 2.9 g/dL — ABNORMAL LOW (ref 3.5–5.0)
BUN: 47 mg/dL — ABNORMAL HIGH (ref 6–20)
CHLORIDE: 100 mmol/L — AB (ref 101–111)
CO2: 27 mmol/L (ref 22–32)
Calcium: 8.6 mg/dL — ABNORMAL LOW (ref 8.9–10.3)
Creatinine, Ser: 10.06 mg/dL — ABNORMAL HIGH (ref 0.44–1.00)
GFR, EST AFRICAN AMERICAN: 5 mL/min — AB (ref >60)
GFR, EST NON AFRICAN AMERICAN: 4 mL/min — AB (ref >60)
Glucose, Bld: 91 mg/dL (ref 65–99)
POTASSIUM: 4.4 mmol/L (ref 3.5–5.1)
Phosphorus: 4.6 mg/dL (ref 2.5–4.6)
Sodium: 137 mmol/L (ref 135–145)

## 2016-05-22 LAB — CBC
HEMATOCRIT: 31.8 % — AB (ref 36.0–46.0)
Hemoglobin: 9.3 g/dL — ABNORMAL LOW (ref 12.0–15.0)
MCH: 27.4 pg (ref 26.0–34.0)
MCHC: 29.2 g/dL — ABNORMAL LOW (ref 30.0–36.0)
MCV: 93.5 fL (ref 78.0–100.0)
PLATELETS: 329 10*3/uL (ref 150–400)
RBC: 3.4 MIL/uL — AB (ref 3.87–5.11)
RDW: 18.1 % — ABNORMAL HIGH (ref 11.5–15.5)
WBC: 9.7 10*3/uL (ref 4.0–10.5)

## 2016-05-22 LAB — PROTIME-INR
INR: 2.13 — ABNORMAL HIGH (ref 0.00–1.49)
Prothrombin Time: 23.7 seconds — ABNORMAL HIGH (ref 11.6–15.2)

## 2016-05-22 LAB — HEPARIN LEVEL (UNFRACTIONATED): HEPARIN UNFRACTIONATED: 0.4 [IU]/mL (ref 0.30–0.70)

## 2016-05-22 MED ORDER — HYDROCODONE-ACETAMINOPHEN 5-325 MG PO TABS: 1.0000 | ORAL_TABLET | Freq: Four times a day (QID) | ORAL | Status: DC | PRN

## 2016-05-22 MED ORDER — RENA-VITE PO TABS: 1.0000 | ORAL_TABLET | Freq: Every day | ORAL | Status: AC

## 2016-05-22 MED ORDER — WARFARIN SODIUM 5 MG PO TABS: 7.5000 mg | ORAL_TABLET | Freq: Once | ORAL | Status: DC

## 2016-05-22 MED ORDER — WARFARIN SODIUM 7.5 MG PO TABS
7.5000 mg | ORAL_TABLET | Freq: Once | ORAL | Status: AC
  Administered 2016-05-22: 7.5 mg via ORAL
  Filled 2016-05-22: qty 1

## 2016-05-22 NOTE — Care Management Important Message (Signed)
Important Message  Patient Details  Name: Christina Walters MRN: [id number] Date of Birth: 05-25-1971   Medicare Important Message Given:  Yes    Bernadette HoitShoffner, Dyna Figuereo Coleman 05/22/2016, 10:27 AM

## 2016-05-22 NOTE — Progress Notes (Signed)
ANTICOAGULATION CONSULT NOTE - Follow Up Consult  Pharmacy Consult for Heparin and Coumadin Indication: VTE; SVC syndrome with SVC clot  Allergies  Allergen Reactions  . Penicillins Hives and Itching    Has patient had a PCN reaction causing immediate rash, facial/tongue/throat swelling, SOB or lightheadedness with hypotension: Yes Has patient had a PCN reaction causing severe rash involving mucus membranes or skin necrosis: No Has patient had a PCN reaction that required hospitalization No Has patient had a PCN reaction occurring within the last 10 years: No If all of the above answers are "NO", then may proceed with Cephalosporin use.   . Sulfa Antibiotics Hives and Itching  . Sulfamethoxazole-Trimethoprim Hives and Itching    Patient Measurements: Height: 6' (182.9 cm) Weight: (!) 305 lb 12.5 oz (138.7 kg) IBW/kg (Calculated) : 73.1 Heparin Dosing Weight: 104 kg  Vital Signs: Temp: 97.7 F (36.5 C) (06/19 0900) Temp Source: Oral (06/19 0900) BP: 106/60 mmHg (06/19 0900) Pulse Rate: 96 (06/19 0900)  Labs:  Recent Labs  05/19/16 1600  05/20/16 0540 05/20/16 1234 05/21/16 0417 05/22/16 0448  HGB 9.4*  --  9.8*  --  10.0*  --   HCT 30.9*  --  32.7*  --  34.3*  --   PLT 285  --  286  --  293  --   LABPROT  --   --  14.6  14.7  --  17.5* 23.7*  INR  --   --  1.12  1.13  --  1.43 2.13*  HEPARINUNFRC  --   < > 0.29* 0.41 0.39 0.40  CREATININE 10.89*  --   --   --   --   --   < > = values in this interval not displayed.  Estimated Creatinine Clearance: 10.2 mL/min (by C-G formula based on Cr of 10.89).  Assessment:   Day # 5 overlap of heparin and Coumadin for SVC clot.   Heparin level remains therapeutic (0.40);  INR up from 1.43 to 2.13, first day therapeutic.   Has had Coumadin 10 mg, 10 mg, 15 mg, then 10 mg.  INR likely to trend up some again tomorrow.  Goal of Therapy:  INR 2-3 Heparin level 0.3-0.7 units/ml Monitor platelets by anticoagulation protocol:  Yes   Plan:   Continue heparin drip at 2150 units/hr.  Coumadin 7.5 mg x 1 today.  Daily heparin level, PT/INR and CBC.  May need 7.5 - 10 mg of Coumadin at discharge.  Would Rx 5 mg tablets to allow for flexibility in dosing.   Dennie Fettersgan, Leniyah Martell Donovan, [place] Pager: [phone] 05/22/2016,11:09 AM

## 2016-05-22 NOTE — Progress Notes (Signed)
[facility] Rounding Note Subjective:   No co  /for HD today  On Schedule   Objective Vital signs in last 24 hours: Filed Vitals:   05/21/16 0758 05/21/16 1649 05/21/16 2108 05/22/16 0503  BP: 108/63 148/44 137/79 111/69  Pulse: 93 87 86 92  Temp: 98.4 F (36.9 C) 98.4 F (36.9 C) 97.9 F (36.6 C) 97.5 F (36.4 C)  TempSrc: Oral Oral Oral Oral  Resp: 18 17 16 19   Height:      Weight:      SpO2: 95% 97% 99% 99%   Weight change:   Physical Exam: General: obese AA fem. NAD / pleasant ,OX3  Heart: RRR, no rub /Mur/gal Lungs: CTA bilat  Abdomen: Obese , NT, ND Extremities:Rlower Extrem Pedal edema 1+/ L BKA  Stable   Dialysis Access: R IJ Perm cath    OP Dialysis Orders:  MWF [facility] 5h 132.5kg 2/2.25 bath Hep 12,000 R IJ cath Hect 10 ug  Labs:   Recent Labs Lab 05/16/16 0900 05/16/16 1345 05/17/16 0502 05/19/16 1600  NA 135  --  135 134*  K 5.7*  --  4.3 4.5  CL 97*  --  96* 99*  CO2 23  --  24 24  GLUCOSE 122*  --  96 85  BUN 72*  --  51* 41*  CREATININE 17.47*  --  13.25* 10.89*  CALCIUM 9.1  --  8.9 8.4*  PHOS  --  9.4* 8.5* 5.3*    Recent Labs Lab 05/17/16 0502 05/19/16 1600  ALBUMIN 3.1* 2.8*   Lab Results  Component Value Date   INR 2.13* 05/22/2016   INR 1.43 05/21/2016   INR 1.12 05/20/2016   INR 1.13 05/20/2016      Recent Labs Lab 05/18/16 0656 05/19/16 0753 05/19/16 1600 05/20/16 0540 05/21/16 0417  WBC 8.3 8.5 9.6 8.7 10.3  HGB 9.3* 9.3* 9.4* 9.8* 10.0*  HCT 30.1* 31.0* 30.9* 32.7* 34.3*  MCV 89.6 90.9 90.6 92.6 93.7  PLT 216 237 285 286 293     Recent Labs Lab 05/17/16 2014 05/18/16 0731 05/18/16 1112 05/18/16 1620 05/18/16 1956  GLUCAP 114* 92 123* 105* 118*    Medications: . heparin 2,150 Units/hr (05/22/16 0355)   . cinacalcet  30 mg Oral Q breakfast  . darbepoetin (ARANESP) injection - DIALYSIS  40 mcg Intravenous Q Fri-HD  . DULoxetine  90 mg Oral Daily  . feeding supplement (PRO-STAT SUGAR FREE 64)   30 mL Oral BID  . gabapentin  300 mg Oral TID  . levothyroxine  88 mcg Oral QAC breakfast  . midodrine  10 mg Oral BID WC  . mirtazapine  7.5 mg Oral QHS  . multivitamin  1 tablet Oral QHS  . sevelamer carbonate  2,400 mg Oral TID WC  . Warfarin - Pharmacist Dosing Inpatient   Does not apply q1800   Problem/Plan:  1. Right atrium/SVC syndrome w SVC clot - cath related - on heparin/coumadin started -  evaluated by IR - clot adherent to catheter and nonocclusive. No Cos of swelling now  /  INR  2.16 this am /  If dc today  Admit team => need to decide who will manage heparin  after d/c;? Prim care is [facility]  in [place] pt vs Coumadin clinic ?  .2. ESRD - MWF - labs  Pending Pre hd/  multiple failed access due to clotting - was to see [facility] to eval past week but was admitted , will schedule  op eval if dc today  3. Anemia - hgb 10.0 on  ESA Aranesp 40 q fri hd  4. Secondary hyperparathyroidism - P elevated ^  Down now with Renal Diet and renvela  3 ac tid;/ Sensipar 30 mg  q day /on  Hectorol 10 q hd ( willl ck op pth level ? as is on rather high hectorol dose) 5. BP/volume  Last  Wt  ON 16TH  up 6 kg by wts.( BED WTS )Todays order attempt 4-5 l uf/On midodrine for low bp's. stable this am  6. Nutrition - changed to renal diet due to high K content of heart healthy diet/vit alb 2.6 - added prostat   Lenny Pastel, PA-C [facility] Beeper [phone] 05/22/2016,8:21 AM  LOS: 7 days   I have seen and examined this patient and agree with plan and assessment in the above note. INR 2.13 therapeutic on coumadin. Primary team will need to arrange outpt coumadin management (not to be managed at the dialysis unit). For regular dialysis today. Will need outpt [facility] appt re further access.  Waynette Towers B,MD 05/22/2016 1:34 PM

## 2016-05-22 NOTE — Discharge Summary (Signed)
Physician Discharge Summary  Christina Walters ZOX:[id number] DOB: [birth year] DOA: 05/15/2016  PCP: Julio Sicks A, DO  Admit date: 05/15/2016 Discharge date: 05/22/2016  Recommendations for Outpatient Follow-up:  1. Pt will need to follow up with PCP in 1-2 weeks post discharge 2. Please note I called PCP office and was told that pt has missed two appointments and they are unable to schedule appointment any longer but she is welcome to come as walk in and re establish herself as new pt 3. Pt was made aware of this and was advised to go to clinic in AM to have PT/INR checked so that the dose of Coumadin can be readjusted if needed  4. Pt has verbalized understanding and ensured me she will go in am to have that done   Discharge Diagnoses:  Principal Problem:   Thrombus of venous dialysis catheter (HCC) Active Problems:   Hyperkalemia   ESRD (end stage renal disease) on dialysis (HCC)   Hypotension  Discharge Condition: Stable  Diet recommendation: Renal diet   Brief Narrative:  45 y.o. female with PMHx of ESRD who presented to [facility] by nephrology for evaluation after unsuccessful attempt to declot right subclavian HD catheter. Pt was found to have pulmonary embolism in right lower lobe, and superior vena cava thromboses. Started on IV Heparin and admitted for further evaluation and treatment. She had catheter exchanged as OP 6/12. Warfarin started 6/15.   Assessment & Plan:  Acute Pulmonary Embolism: - was on heparin drip and and oral warfarin.  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 No evidence of superfcial thrombus in left arm. Two stent in the upper arm  Antimicrobials:   None  Procedures/Studies: Dg Chest 2 View  05/15/2016  CLINICAL DATA:  Dizziness with syncopal episodes for 1 week. Hemodialysis patient. EXAM: CHEST  2 VIEW COMPARISON:  None. FINDINGS: Examination is limited by body habitus and suboptimal inspiration. There is asymmetric elevation of the right hemidiaphragm with associated right basilar airspace disease. There is a minimal left lower lobe atelectasis. No edema or significant pleural effusion is present. There is a right IJ dialysis catheter which extends into the right atrium, tip incompletely visualized. The heart size is normal. IMPRESSION: Asymmetric right lower lobe atelectasis or infiltrate. The tip of the dialysis catheter is incompletely visualized. Electronically Signed   By: Carey Bullocks M.D.   On: 05/15/2016 20:00   Ct Angio Chest Pe W/cm &/or Wo Cm  05/16/2016  CLINICAL DATA:  Dizziness  and syncopal episodes for 1 week. Deep vein thrombosis noted at vascular centered to during catheter exchange. History of end-stage renal disease on dialysis and, diabetes. EXAM: CT ANGIOGRAPHY CHEST WITH CONTRAST TECHNIQUE: Multidetector CT imaging of the chest was performed using the standard protocol during bolus administration of intravenous contrast. Multiplanar CT image reconstructions  and MIPs were obtained to evaluate the vascular anatomy. CONTRAST:  100 cc Isovue 370 COMPARISON:  Chest radiograph May 15, 2016 FINDINGS: Large body habitus results in overall noisy image quality. PULMONARY ARTERY: Adequate contrast opacification of the pulmonary artery's. Main pulmonary artery is not enlarged. Nearly occlusive RIGHT lower lobe pulmonary artery embolism casting into the segmental branches. MEDIASTINUM: Heart and pericardium are unremarkable, no right heart strain. Thoracic aorta is normal course and caliber, unremarkable. No lymphadenopathy by CT size criteria. RIGHT internal jugular central venous catheter distal tip and knee inferior vena cava, below the intrahepatic IVC. Contrast outlining probable thrombus in the superior vena cava and brachiocephalic confluence. LUNGS: Tracheobronchial tree is patent, no pneumothorax. RIGHT lower lobe atelectasis. Elevated RIGHT hemidiaphragm. SOFT TISSUES AND OSSEOUS STRUCTURES: Atrophic native kidneys. Multilevel Schmorl's nodes without acute osseous process. Broad thoracolumbar levoscoliosis. Review of the MIP images confirms the above findings. IMPRESSION: Acute RIGHT lower lobe nonocclusive pulmonary embolism. RIGHT lower lobe atelectasis and possible infarct though, less likely. 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 Dialysis catheter via RIGHT internal jugular venous approach, distal tip in inferior vena cava. Acute findings discussed with and reconfirmed by Dr.JARED GARDNER on 05/16/2016 at 3:09 am. Electronically Signed   By: Awilda Metro M.D.   On: 05/16/2016 03:11   Discharge Exam: Filed Vitals:   05/22/16 1538 05/22/16 1603  BP: 113/62 130/63  Pulse: 96 95  Temp:  98 F (36.7 C)  Resp:  18   Filed Vitals:   05/22/16 1430 05/22/16 1506 05/22/16 1538 05/22/16 1603  BP: 137/65 107/58 113/62 130/63  Pulse: 86 94 96 95  Temp:    98 F (36.7 C)  TempSrc:    Oral  Resp:    18  Height:      Weight:       SpO2:    100%    General: Pt is alert, follows commands appropriately, not in acute distress Cardiovascular: Regular rate and rhythm, no rubs, no gallops Respiratory: Clear to auscultation bilaterally, no wheezing, no crackles, no rhonchi Abdominal: Soft, non tender, non distended, bowel sounds +, no guarding   Discharge Instructions  Discharge Instructions    Diet - renal diet  Complete by:  As directed      Increase activity slowly    Complete by:  As directed             Medication List    STOP taking these medications        aspirin 81 MG tablet      TAKE these medications        cinacalcet 30 MG tablet  Commonly known as:  SENSIPAR  Take 30 mg by mouth daily with breakfast.     cyclobenzaprine 10 MG tablet  Commonly known as:  FLEXERIL  Take 10 mg by mouth 3 (three) times daily as needed for muscle spasms.     Docusate Sodium 100 MG capsule  Take 100 mg by mouth 2 (two) times daily as needed for constipation.     DULoxetine 30 MG capsule  Commonly known as:  CYMBALTA  Take 30 mg by mouth daily.  DULoxetine 60 MG capsule  Commonly known as:  CYMBALTA  Take 60 mg by mouth daily.     gabapentin 600 MG tablet  Commonly known as:  NEURONTIN  Take 300 mg by mouth 3 (three) times daily.     HYDROcodone-acetaminophen 5-325 MG tablet  Commonly known as:  NORCO/VICODIN  Take 1 tablet by mouth every 6 (six) hours as needed for moderate pain.     hydrOXYzine 50 MG tablet  Commonly known as:  ATARAX/VISTARIL  Take 50 mg by mouth 3 (three) times daily as needed for itching.     levothyroxine 88 MCG tablet  Commonly known as:  SYNTHROID, LEVOTHROID  Take 88 mcg by mouth daily before breakfast.     midodrine 5 MG tablet  Commonly known as:  PROAMATINE  Take 5 mg by mouth 2 (two) times daily with a meal.     mirtazapine 7.5 MG tablet  Commonly known as:  REMERON  Take 7.5 mg by mouth at bedtime.     multivitamin Tabs tablet  Take 1 tablet by mouth  at bedtime.     RENVELA 800 MG tablet  Generic drug:  sevelamer carbonate  Take 800 mg by mouth 3 (three) times daily with meals.     traZODone 50 MG tablet  Commonly known as:  DESYREL  Take 50 mg by mouth at bedtime as needed for sleep.     warfarin 5 MG tablet  Commonly known as:  COUMADIN  Take 1.5 tablets (7.5 mg total) by mouth one time only at 6 PM.           Follow-up Information    Follow up with BONSU, OSEI A, DO On 05/23/2016.   Specialty:  Internal Medicine   Why:  Please go to the office on 05/23/2016 to have PT/INR checked, you must be there before 9 am   Contact information:   [address] [place] [id number] [phone]       Call Debbora PrestoMAGICK-Jessicca Stitzer, MD.   Specialty:  Internal Medicine   Why:  As needed   Contact information:   [address] [place] [address] [phone]        The results of significant diagnostics from this hospitalization (including imaging, microbiology, ancillary and laboratory) are listed below for reference.     Microbiology: Recent Results (from the past 240 hour(s))  MRSA PCR Screening     Status: None   Collection Time: 05/17/16  9:15 PM  Result Value Ref Range Status   MRSA by PCR NEGATIVE NEGATIVE Final    Comment:        The GeneXpert MRSA Assay (FDA approved for NASAL specimens only), is one component of a comprehensive MRSA colonization surveillance program. It is not intended to diagnose MRSA infection nor to guide or monitor treatment for MRSA infections.      Labs: Basic Metabolic Panel:  Recent Labs Lab 05/15/16 1810 05/16/16 0142 05/16/16 0900 05/16/16 1345 05/17/16 0502 05/19/16 1600 05/22/16 1203  NA 137  --  135  --  135 134* 137  K 6.1* 5.1 5.7*  --  4.3 4.5 4.4  CL 100*  --  97*  --  96* 99* 100*  CO2 23  --  23  --  24 24 27   GLUCOSE 75  --  122*  --  96 85 91  BUN 68*  --  72*  --  51* 41* 47*  CREATININE 16.63*  --  17.47*  --  13.25* 10.89* 10.06*   CALCIUM 8.9  --  9.1  --  8.9 8.4* 8.6*  MG  --   --   --  2.3  --   --   --   PHOS  --   --   --  9.4* 8.5* 5.3* 4.6   Liver Function Tests:  Recent Labs Lab 05/17/16 0502 05/19/16 1600 05/22/16 1203  ALBUMIN 3.1* 2.8* 2.9*   No results for input(s): LIPASE, AMYLASE in the last 168 hours. No results for input(s): AMMONIA in the last 168 hours. CBC:  Recent Labs Lab 05/19/16 0753 05/19/16 1600 05/20/16 0540 05/21/16 0417 05/22/16 1200  WBC 8.5 9.6 8.7 10.3 9.7  HGB 9.3* 9.4* 9.8* 10.0* 9.3*  HCT 31.0* 30.9* 32.7* 34.3* 31.8*  MCV 90.9 90.6 92.6 93.7 93.5  PLT 237 285 286 293 329   Cardiac Enzymes: No results for input(s): CKTOTAL, CKMB, CKMBINDEX, TROPONINI in the last 168 hours. BNP: BNP (last 3 results) No results for input(s): BNP in the last 8760 hours.  ProBNP (last 3 results) No results for input(s): PROBNP in the last 8760 hours.  CBG:  Recent Labs Lab 05/17/16 2014 05/18/16 0731 05/18/16 1112 05/18/16 1620 05/18/16 1956  GLUCAP 114* 92 123* 105* 118*     SIGNED: Time coordinating discharge: Over 30 minutes  Debbora Presto, MD  [facility] 05/22/2016, 4:15 PM Pager [phone]  If 7PM-7AM, please contact night-coverage [email or web address] Password [email or web address]

## 2016-05-22 NOTE — Procedures (Signed)
I have personally attended this patient's dialysis session.   TDC 400 BP stable 2K bath K 4.4. UF goal 4-5 liters  Christina Balynthia Dimitria Ketchum, MD [facility] [phone] Pager 05/22/2016, 1:41 PM

## 2016-05-22 NOTE — Progress Notes (Signed)
Darryl LentKelisha M Judd-Durante to be D/C'd Home per MD order.  Discussed prescriptions and follow up appointments with the patient. Prescriptions given to patient, medication list explained in detail. Pt verbalized understanding.    Medication List    STOP taking these medications        aspirin 81 MG tablet      TAKE these medications        cinacalcet 30 MG tablet  Commonly known as:  SENSIPAR  Take 30 mg by mouth daily with breakfast.     cyclobenzaprine 10 MG tablet  Commonly known as:  FLEXERIL  Take 10 mg by mouth 3 (three) times daily as needed for muscle spasms.     Docusate Sodium 100 MG capsule  Take 100 mg by mouth 2 (two) times daily as needed for constipation.     DULoxetine 30 MG capsule  Commonly known as:  CYMBALTA  Take 30 mg by mouth daily.     DULoxetine 60 MG capsule  Commonly known as:  CYMBALTA  Take 60 mg by mouth daily.     gabapentin 600 MG tablet  Commonly known as:  NEURONTIN  Take 300 mg by mouth 3 (three) times daily.     HYDROcodone-acetaminophen 5-325 MG tablet  Commonly known as:  NORCO/VICODIN  Take 1 tablet by mouth every 6 (six) hours as needed for moderate pain.     hydrOXYzine 50 MG tablet  Commonly known as:  ATARAX/VISTARIL  Take 50 mg by mouth 3 (three) times daily as needed for itching.     levothyroxine 88 MCG tablet  Commonly known as:  SYNTHROID, LEVOTHROID  Take 88 mcg by mouth daily before breakfast.     midodrine 5 MG tablet  Commonly known as:  PROAMATINE  Take 5 mg by mouth 2 (two) times daily with a meal.     mirtazapine 7.5 MG tablet  Commonly known as:  REMERON  Take 7.5 mg by mouth at bedtime.     multivitamin Tabs tablet  Take 1 tablet by mouth at bedtime.     RENVELA 800 MG tablet  Generic drug:  sevelamer carbonate  Take 800 mg by mouth 3 (three) times daily with meals.     traZODone 50 MG tablet  Commonly known as:  DESYREL  Take 50 mg by mouth at bedtime as needed for sleep.     warfarin 5 MG tablet   Commonly known as:  COUMADIN  Take 1.5 tablets (7.5 mg total) by mouth one time only at 6 PM.        Filed Vitals:   05/22/16 1603 05/22/16 1702  BP: 130/63 107/65  Pulse: 95 98  Temp: 98 F (36.7 C) 98.6 F (37 C)  Resp: 18 18    Skin clean, dry and intact without evidence of skin break down, no evidence of skin tears noted. IV catheter discontinued intact. Site without signs and symptoms of complications. Dressing and pressure applied. Pt denies pain at this time. No complaints noted.  An After Visit Summary was printed and given to the patient. Patient escorted via WC, and D/C home via private auto.  Modena NunneryHubbard, Gethsemane Fischler C 05/22/2016 6:42 PM

## 2016-06-04 ENCOUNTER — Emergency Department (HOSPITAL_BASED_OUTPATIENT_CLINIC_OR_DEPARTMENT_OTHER)
Admission: EM | Admit: 2016-06-04 | Discharge: 2016-06-04 | Disposition: A | Discharge status: Home / Self Care | Payer: Medicare Other | Attending: Emergency Medicine | Admitting: Emergency Medicine

## 2016-06-04 ENCOUNTER — Encounter (HOSPITAL_BASED_OUTPATIENT_CLINIC_OR_DEPARTMENT_OTHER): Payer: Self-pay | Admitting: Emergency Medicine

## 2016-06-04 DIAGNOSIS — E1122 Type 2 diabetes mellitus with diabetic chronic kidney disease: Secondary | ICD-10-CM | POA: Insufficient documentation

## 2016-06-04 DIAGNOSIS — Z992 Dependence on renal dialysis: Secondary | ICD-10-CM | POA: Diagnosis not present

## 2016-06-04 DIAGNOSIS — N186 End stage renal disease: Secondary | ICD-10-CM | POA: Insufficient documentation

## 2016-06-04 DIAGNOSIS — Z7984 Long term (current) use of oral hypoglycemic drugs: Secondary | ICD-10-CM | POA: Insufficient documentation

## 2016-06-04 DIAGNOSIS — F329 Major depressive disorder, single episode, unspecified: Secondary | ICD-10-CM | POA: Diagnosis not present

## 2016-06-04 DIAGNOSIS — Z0000 Encounter for general adult medical examination without abnormal findings: Secondary | ICD-10-CM | POA: Diagnosis not present

## 2016-06-04 DIAGNOSIS — E039 Hypothyroidism, unspecified: Secondary | ICD-10-CM | POA: Diagnosis not present

## 2016-06-04 DIAGNOSIS — M19042 Primary osteoarthritis, left hand: Secondary | ICD-10-CM | POA: Diagnosis not present

## 2016-06-04 DIAGNOSIS — R791 Abnormal coagulation profile: Secondary | ICD-10-CM | POA: Diagnosis present

## 2016-06-04 LAB — CBC WITH DIFFERENTIAL/PLATELET
BASOS ABS: 0 10*3/uL (ref 0.0–0.1)
Basophils Relative: 0 %
EOS ABS: 0.3 10*3/uL (ref 0.0–0.7)
Eosinophils Relative: 4 %
HEMATOCRIT: 37.6 % (ref 36.0–46.0)
HEMOGLOBIN: 11.4 g/dL — AB (ref 12.0–15.0)
LYMPHS PCT: 23 %
Lymphs Abs: 2 10*3/uL (ref 0.7–4.0)
MCH: 27.7 pg (ref 26.0–34.0)
MCHC: 30.3 g/dL (ref 30.0–36.0)
MCV: 91.5 fL (ref 78.0–100.0)
MONOS PCT: 10 %
Monocytes Absolute: 0.9 10*3/uL (ref 0.1–1.0)
NEUTROS ABS: 5.5 10*3/uL (ref 1.7–7.7)
NEUTROS PCT: 63 %
Platelets: 367 10*3/uL (ref 150–400)
RBC: 4.11 MIL/uL (ref 3.87–5.11)
RDW: 16.7 % — AB (ref 11.5–15.5)
WBC: 8.7 10*3/uL (ref 4.0–10.5)

## 2016-06-04 LAB — COMPREHENSIVE METABOLIC PANEL
ALBUMIN: 3.6 g/dL (ref 3.5–5.0)
ALT: 14 U/L (ref 14–54)
AST: 29 U/L (ref 15–41)
Alkaline Phosphatase: 118 U/L (ref 38–126)
Anion gap: 14 (ref 5–15)
BILIRUBIN TOTAL: 0.6 mg/dL (ref 0.3–1.2)
BUN: 37 mg/dL — ABNORMAL HIGH (ref 6–20)
CO2: 24 mmol/L (ref 22–32)
CREATININE: 8.68 mg/dL — AB (ref 0.44–1.00)
Calcium: 9.2 mg/dL (ref 8.9–10.3)
Chloride: 96 mmol/L — ABNORMAL LOW (ref 101–111)
GFR calc Af Amer: 6 mL/min — ABNORMAL LOW (ref >60)
GFR, EST NON AFRICAN AMERICAN: 5 mL/min — AB (ref >60)
GLUCOSE: 176 mg/dL — AB (ref 65–99)
POTASSIUM: 4.1 mmol/L (ref 3.5–5.1)
Sodium: 134 mmol/L — ABNORMAL LOW (ref 135–145)
TOTAL PROTEIN: 9 g/dL — AB (ref 6.5–8.1)

## 2016-06-04 LAB — PROTIME-INR
INR: 2.7 — ABNORMAL HIGH (ref 0.00–1.49)
PROTHROMBIN TIME: 28.3 s — AB (ref 11.6–15.2)

## 2016-06-04 NOTE — ED Notes (Signed)
Here for lab recheck, (denies: pain, sob, nvd, fever, dizziness or other sx), BUE with unused fistulas, HD R chest cath in place, has HD on MWF. Alert, NAD, calm, interactive, resps e/u, speaking in clear complete sentences, no dyspnea noted, skin W&D. [facility]

## 2016-06-04 NOTE — ED Provider Notes (Signed)
CSN: [id number]     Arrival date & time 06/04/16  0108 History   First MD Initiated Contact with Patient 06/04/16 [phone]     Chief Complaint  Patient presents with  . Abnormal Lab     (Consider location/radiation/quality/duration/timing/severity/associated sxs/prior Treatment) Patient is a 45 y.o. female presenting with general illness. The history is provided by the patient.  Illness Location:  Told to come to the ED for an INR of 13.5 Quality:  No bleeding Severity:  Moderate Onset quality:  Gradual Timing:  Constant Progression:  Unchanged Chronicity:  New Context:  On dialysis Relieved by:  Nothing Worsened by:  Continuing to take coumadin Ineffective treatments:  None Associated symptoms: no fever and no vomiting   Risk factors:  ESRD   Past Medical History  Diagnosis Date  . Pulmonary embolism (HCC) 05/15/2016    "small one"  . Pneumonia X 1  . OSA (obstructive sleep apnea)     "waiting to get my test done cause I need a new machine" (05/16/2016)  . On home oxygen therapy     "2-3L when I sleep at night" (05/16/2016)  . Hypothyroidism   . Type II diabetes mellitus (HCC) dx'd 1981  . History of blood transfusion 2012    "related to leg OR"  . Arthritis     "left hand" (05/16/2016)  . Chronic lower back pain   . Anxiety   . Depression   . ESRD (end stage renal disease) on dialysis [facility](HCC)     "MWF; [facility]" (05/16/2016)   Past Surgical History  Procedure Laterality Date  . Below knee leg amputation Left 2012  . Arteriovenous graft placement Bilateral   . Thrombectomy / arteriovenous graft revision Bilateral   . Dilation and curettage of uterus    . Incision and drainage of wound Right ~ 2011    "bubble mass in my thigh"  . Debridment of decubitus ulcer Bilateral ~ 2011    "hips"   History reviewed. No pertinent family history. Social History  Substance Use Topics  . Smoking status: Never Smoker   . Smokeless tobacco: Never Used  . Alcohol Use: No   OB  History    No data available     Review of Systems  Constitutional: Negative for fever.  Gastrointestinal: Negative for vomiting.  All other systems reviewed and are negative.     Allergies  Penicillins; Sulfa antibiotics; and Sulfamethoxazole-trimethoprim  Home Medications   Prior to Admission medications   Medication Sig Start Date End Date Taking? Authorizing Provider  cinacalcet (SENSIPAR) 30 MG tablet Take 30 mg by mouth daily with breakfast.     Historical Provider, MD  cyclobenzaprine (FLEXERIL) 10 MG tablet Take 10 mg by mouth 3 (three) times daily as needed for muscle spasms.  03/29/15   Historical Provider, MD  Docusate Sodium 100 MG capsule Take 100 mg by mouth 2 (two) times daily as needed for constipation.  10/30/12   Historical Provider, MD  DULoxetine (CYMBALTA) 30 MG capsule Take 30 mg by mouth daily. 05/07/16   Historical Provider, MD  DULoxetine (CYMBALTA) 60 MG capsule Take 60 mg by mouth daily. 05/07/16   Historical Provider, MD  gabapentin (NEURONTIN) 600 MG tablet Take 300 mg by mouth 3 (three) times daily.  04/16/15   Historical Provider, MD  HYDROcodone-acetaminophen (NORCO/VICODIN) 5-325 MG tablet Take 1 tablet by mouth every 6 (six) hours as needed for moderate pain. 05/22/16   Dorothea OgleIskra M Myers, MD  hydrOXYzine (ATARAX/VISTARIL) 50 MG  tablet Take 50 mg by mouth 3 (three) times daily as needed for itching.  11/03/15   Historical Provider, MD  levothyroxine (SYNTHROID, LEVOTHROID) 88 MCG tablet Take 88 mcg by mouth daily before breakfast.  08/27/13   Historical Provider, MD  midodrine (PROAMATINE) 5 MG tablet Take 5 mg by mouth 2 (two) times daily with a meal.     Historical Provider, MD  mirtazapine (REMERON) 7.5 MG tablet Take 7.5 mg by mouth at bedtime.  04/28/14   Historical Provider, MD  multivitamin (RENA-VIT) TABS tablet Take 1 tablet by mouth at bedtime. 05/22/16   Dorothea Ogle, MD  sevelamer carbonate (RENVELA) 800 MG tablet Take 800 mg by mouth 3 (three) times  daily with meals.     Historical Provider, MD  traZODone (DESYREL) 50 MG tablet Take 50 mg by mouth at bedtime as needed for sleep.    Historical Provider, MD  warfarin (COUMADIN) 5 MG tablet Take 1.5 tablets (7.5 mg total) by mouth one time only at 6 PM. 05/22/16   Dorothea Ogle, MD   BP 90/64 mmHg  Pulse 106  Temp(Src) 98.3 F (36.8 C) (Oral)  Resp 18  Ht 6' (1.829 m)  Wt 285 lb (129.275 kg)  BMI 38.64 kg/m2  SpO2 96%  LMP 02/01/2016 Physical Exam  Constitutional: She is oriented to person, place, and time. She appears well-developed and well-nourished.  HENT:  Head: Normocephalic and atraumatic.  Mouth/Throat: Oropharynx is clear and moist.  Eyes: Pupils are equal, round, and reactive to light.  Neck: Normal range of motion. Neck supple.  Cardiovascular: Normal rate, regular rhythm and intact distal pulses.   Pulmonary/Chest: Effort normal and breath sounds normal. No respiratory distress. She has no wheezes. She has no rales.  Abdominal: Soft. Bowel sounds are normal. She exhibits no distension. There is no tenderness. There is no rebound.  Musculoskeletal: Normal range of motion.  Neurological: She is alert and oriented to person, place, and time.  Skin: Skin is warm and dry.  Psychiatric: She has a normal mood and affect.    ED Course  Procedures (including critical care time) Labs Review Labs Reviewed  CBC WITH DIFFERENTIAL/PLATELET - Abnormal; Notable for the following:    Hemoglobin 11.4 (*)    RDW 16.7 (*)    All other components within normal limits  COMPREHENSIVE METABOLIC PANEL - Abnormal; Notable for the following:    Sodium 134 (*)    Chloride 96 (*)    Glucose, Bld 176 (*)    BUN 37 (*)    Creatinine, Ser 8.68 (*)    Total Protein 9.0 (*)    GFR calc non Af Amer 5 (*)    GFR calc Af Amer 6 (*)    All other components within normal limits  PROTIME-INR - Abnormal; Notable for the following:    Prothrombin Time 28.3 (*)    INR 2.70 (*)    All other  components within normal limits    Imaging Review No results found. I have personally reviewed and evaluated these images and lab results as part of my medical decision-making.   EKG Interpretation None      MDM   Final diagnoses:  Normal physical exam    Filed Vitals:   06/04/16 0114 06/04/16 0304  BP: 127/75 90/64  Pulse: 116 106  Temp: 98.3 F (36.8 C)   Resp: 20 18   Results for orders placed or performed during the hospital encounter of 06/04/16  CBC with Differential/Platelet  Result Value Ref Range   WBC 8.7 4.0 - 10.5 K/uL   RBC 4.11 3.87 - 5.11 MIL/uL   Hemoglobin 11.4 (L) 12.0 - 15.0 g/dL   HCT 16.1 09.6 - 04.5 %   MCV 91.5 78.0 - 100.0 fL   MCH 27.7 26.0 - 34.0 pg   MCHC 30.3 30.0 - 36.0 g/dL   RDW 40.9 (H) 81.1 - 91.4 %   Platelets 367 150 - 400 K/uL   Neutrophils Relative % 63 %   Lymphocytes Relative 23 %   Monocytes Relative 10 %   Eosinophils Relative 4 %   Basophils Relative 0 %   Neutro Abs 5.5 1.7 - 7.7 K/uL   Lymphs Abs 2.0 0.7 - 4.0 K/uL   Monocytes Absolute 0.9 0.1 - 1.0 K/uL   Eosinophils Absolute 0.3 0.0 - 0.7 K/uL   Basophils Absolute 0.0 0.0 - 0.1 K/uL  Comprehensive metabolic panel  Result Value Ref Range   Sodium 134 (L) 135 - 145 mmol/L   Potassium 4.1 3.5 - 5.1 mmol/L   Chloride 96 (L) 101 - 111 mmol/L   CO2 24 22 - 32 mmol/L   Glucose, Bld 176 (H) 65 - 99 mg/dL   BUN 37 (H) 6 - 20 mg/dL   Creatinine, Ser 7.82 (H) 0.44 - 1.00 mg/dL   Calcium 9.2 8.9 - 95.6 mg/dL   Total Protein 9.0 (H) 6.5 - 8.1 g/dL   Albumin 3.6 3.5 - 5.0 g/dL   AST 29 15 - 41 U/L   ALT 14 14 - 54 U/L   Alkaline Phosphatase 118 38 - 126 U/L   Total Bilirubin 0.6 0.3 - 1.2 mg/dL   GFR calc non Af Amer 5 (L) >60 mL/min   GFR calc Af Amer 6 (L) >60 mL/min   Anion gap 14 5 - 15  Protime-INR  Result Value Ref Range   Prothrombin Time 28.3 (H) 11.6 - 15.2 seconds   INR 2.70 (H) 0.00 - 1.49   Dg Chest 2 View  05/15/2016  CLINICAL DATA:  Dizziness with  syncopal episodes for 1 week. Hemodialysis patient. EXAM: CHEST  2 VIEW COMPARISON:  None. FINDINGS: Examination is limited by body habitus and suboptimal inspiration. There is asymmetric elevation of the right hemidiaphragm with associated right basilar airspace disease. There is a minimal left lower lobe atelectasis. No edema or significant pleural effusion is present. There is a right IJ dialysis catheter which extends into the right atrium, tip incompletely visualized. The heart size is normal. IMPRESSION: Asymmetric right lower lobe atelectasis or infiltrate. The tip of the dialysis catheter is incompletely visualized. Electronically Signed   By: Carey Bullocks M.D.   On: 05/15/2016 20:00   Ct Angio Chest Pe W/cm &/or Wo Cm  05/16/2016  CLINICAL DATA:  Dizziness and syncopal episodes for 1 week. Deep vein thrombosis noted at vascular centered to during catheter exchange. History of end-stage renal disease on dialysis and, diabetes. EXAM: CT ANGIOGRAPHY CHEST WITH CONTRAST TECHNIQUE: Multidetector CT imaging of the chest was performed using the standard protocol during bolus administration of intravenous contrast. Multiplanar CT image reconstructions and MIPs were obtained to evaluate the vascular anatomy. CONTRAST:  100 cc Isovue 370 COMPARISON:  Chest radiograph May 15, 2016 FINDINGS: Large body habitus results in overall noisy image quality. PULMONARY ARTERY: Adequate contrast opacification of the pulmonary artery's. Main pulmonary artery is not enlarged. Nearly occlusive RIGHT lower lobe pulmonary artery embolism casting into the segmental branches. MEDIASTINUM: Heart and pericardium are unremarkable,  no right heart strain. Thoracic aorta is normal course and caliber, unremarkable. No lymphadenopathy by CT size criteria. RIGHT internal jugular central venous catheter distal tip and knee inferior vena cava, below the intrahepatic IVC. Contrast outlining probable thrombus in the superior vena cava and  brachiocephalic confluence. LUNGS: Tracheobronchial tree is patent, no pneumothorax. RIGHT lower lobe atelectasis. Elevated RIGHT hemidiaphragm. SOFT TISSUES AND OSSEOUS STRUCTURES: Atrophic native kidneys. Multilevel Schmorl's nodes without acute osseous process. Broad thoracolumbar levoscoliosis. Review of the MIP images confirms the above findings. IMPRESSION: Acute RIGHT lower lobe nonocclusive pulmonary embolism. RIGHT lower lobe atelectasis and possible infarct though, less likely. 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 Dialysis catheter via RIGHT internal jugular venous approach, distal tip in inferior vena cava. Acute findings discussed with and reconfirmed by Dr.JARED GARDNER on 05/16/2016 at 3:09 am. Electronically Signed   By: Awilda Metroourtnay  Bloomer M.D.   On: 05/16/2016 03:11    INR is normal for a patient on coumadin for PE in the ED at 2.7 continued your medication.  Follow up with renal    Christina Kinnear, MD 06/04/16 [id number]

## 2016-06-04 NOTE — ED Notes (Signed)
Pt was called from dialysis center today and was told PT/INR was 13.5 and was told to come to ED.  PT is currently on coumadin, was told not to take it today or tomorrow, pt is scheduled for dialysis Monday and will have a redraw done on that day.

## 2016-06-22 ENCOUNTER — Other Ambulatory Visit (HOSPITAL_COMMUNITY): Payer: Medicare Other

## 2016-06-22 ENCOUNTER — Encounter (HOSPITAL_COMMUNITY): Payer: Medicare Other

## 2016-06-27 ENCOUNTER — Ambulatory Visit: Payer: Medicare Other | Admitting: Vascular Surgery

## 2016-07-03 ENCOUNTER — Encounter (HOSPITAL_COMMUNITY)
Admission: RE | Disposition: A | Discharge status: Home / Self Care | Payer: Self-pay | Source: Ambulatory Visit | Attending: Vascular Surgery

## 2016-07-03 ENCOUNTER — Ambulatory Visit (HOSPITAL_COMMUNITY)
Admission: RE | Admit: 2016-07-03 | Discharge: 2016-07-03 | Disposition: A | Discharge status: Home / Self Care | Payer: Medicare Other | Source: Ambulatory Visit | Attending: Vascular Surgery | Admitting: Vascular Surgery

## 2016-07-03 ENCOUNTER — Encounter (HOSPITAL_COMMUNITY): Payer: Self-pay | Admitting: Vascular Surgery

## 2016-07-03 DIAGNOSIS — G4733 Obstructive sleep apnea (adult) (pediatric): Secondary | ICD-10-CM | POA: Diagnosis not present

## 2016-07-03 DIAGNOSIS — Z880 Allergy status to penicillin: Secondary | ICD-10-CM | POA: Diagnosis not present

## 2016-07-03 DIAGNOSIS — N186 End stage renal disease: Secondary | ICD-10-CM | POA: Insufficient documentation

## 2016-07-03 DIAGNOSIS — E669 Obesity, unspecified: Secondary | ICD-10-CM | POA: Diagnosis not present

## 2016-07-03 DIAGNOSIS — F419 Anxiety disorder, unspecified: Secondary | ICD-10-CM | POA: Diagnosis not present

## 2016-07-03 DIAGNOSIS — E039 Hypothyroidism, unspecified: Secondary | ICD-10-CM | POA: Insufficient documentation

## 2016-07-03 DIAGNOSIS — Z86711 Personal history of pulmonary embolism: Secondary | ICD-10-CM | POA: Diagnosis not present

## 2016-07-03 DIAGNOSIS — Z6839 Body mass index (BMI) 39.0-39.9, adult: Secondary | ICD-10-CM | POA: Diagnosis not present

## 2016-07-03 DIAGNOSIS — Z882 Allergy status to sulfonamides status: Secondary | ICD-10-CM | POA: Diagnosis not present

## 2016-07-03 DIAGNOSIS — Z9981 Dependence on supplemental oxygen: Secondary | ICD-10-CM | POA: Insufficient documentation

## 2016-07-03 DIAGNOSIS — E1122 Type 2 diabetes mellitus with diabetic chronic kidney disease: Secondary | ICD-10-CM | POA: Insufficient documentation

## 2016-07-03 DIAGNOSIS — T82898A Other specified complication of vascular prosthetic devices, implants and grafts, initial encounter: Secondary | ICD-10-CM | POA: Diagnosis not present

## 2016-07-03 DIAGNOSIS — M1990 Unspecified osteoarthritis, unspecified site: Secondary | ICD-10-CM | POA: Diagnosis not present

## 2016-07-03 DIAGNOSIS — Z89512 Acquired absence of left leg below knee: Secondary | ICD-10-CM | POA: Diagnosis not present

## 2016-07-03 DIAGNOSIS — M545 Low back pain: Secondary | ICD-10-CM | POA: Insufficient documentation

## 2016-07-03 DIAGNOSIS — Z992 Dependence on renal dialysis: Secondary | ICD-10-CM | POA: Insufficient documentation

## 2016-07-03 DIAGNOSIS — N185 Chronic kidney disease, stage 5: Secondary | ICD-10-CM | POA: Diagnosis not present

## 2016-07-03 DIAGNOSIS — I120 Hypertensive chronic kidney disease with stage 5 chronic kidney disease or end stage renal disease: Secondary | ICD-10-CM | POA: Diagnosis not present

## 2016-07-03 DIAGNOSIS — G8929 Other chronic pain: Secondary | ICD-10-CM | POA: Diagnosis not present

## 2016-07-03 DIAGNOSIS — F329 Major depressive disorder, single episode, unspecified: Secondary | ICD-10-CM | POA: Insufficient documentation

## 2016-07-03 HISTORY — PX: PERIPHERAL VASCULAR CATHETERIZATION: SHX172C

## 2016-07-03 LAB — POCT I-STAT, CHEM 8
BUN: 40 mg/dL — AB (ref 6–20)
Calcium, Ion: 1.1 mmol/L — ABNORMAL LOW (ref 1.13–1.30)
Chloride: 96 mmol/L — ABNORMAL LOW (ref 101–111)
Creatinine, Ser: 11.5 mg/dL — ABNORMAL HIGH (ref 0.44–1.00)
Glucose, Bld: 123 mg/dL — ABNORMAL HIGH (ref 65–99)
HEMATOCRIT: 45 % (ref 36.0–46.0)
Hemoglobin: 15.3 g/dL — ABNORMAL HIGH (ref 12.0–15.0)
Potassium: 4.9 mmol/L (ref 3.5–5.1)
SODIUM: 134 mmol/L — AB (ref 135–145)
TCO2: 26 mmol/L (ref 0–100)

## 2016-07-03 LAB — PROTIME-INR
INR: 2.01
PROTHROMBIN TIME: 23.1 s — AB (ref 11.4–15.2)

## 2016-07-03 LAB — HCG, SERUM, QUALITATIVE: Preg, Serum: NEGATIVE

## 2016-07-03 SURGERY — UPPER EXTREMITY VENOGRAPHY
Anesthesia: LOCAL

## 2016-07-03 MED ORDER — IODIXANOL 320 MG/ML IV SOLN
INTRAVENOUS | Status: DC | PRN
  Administered 2016-07-03: 50 mL via INTRAVENOUS

## 2016-07-03 MED ORDER — SODIUM CHLORIDE 0.9% FLUSH: 3.0000 mL | INTRAVENOUS | Status: DC | PRN

## 2016-07-03 MED ORDER — HEPARIN (PORCINE) IN NACL 2-0.9 UNIT/ML-% IJ SOLN: INTRAMUSCULAR | Status: DC | PRN

## 2016-07-03 SURGICAL SUPPLY — 2 items
STOPCOCK MORSE 400PSI 3WAY (MISCELLANEOUS) ×2 IMPLANT
TUBING CIL FLEX 10 FLL-RA (TUBING) ×2 IMPLANT

## 2016-07-03 NOTE — H&P (Signed)
[facility] [place]  Patient name: Christina Walters MRN: [id number] DOB: 1971/01/07 Sex: female  REASON FOR CONSULT: for venogram right upper extremity  HPI: Christina Walters is a 45 y.o. female, who I saw in consultation on 04/12/2016. She has had forearm and upper arm grafts on both sides. Most recently, she had a left HeRo raft done in [facility] in March 2017. This has occluded. She did have a previous left thigh AV graft which had to be removed because of infection. She's never had a right thigh AV graft. 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 She dialyzes on Monday Wednesdays and Fridays. She has a functioning tunneled dialysis catheter.  She denies any recent uremic symptoms. Specifically she denies nausea, vomiting, fatigue, anorexia, or palpitations.  Past Medical History:  Diagnosis Date  . Anxiety   . Arthritis    "left hand" (05/16/2016)  . Chronic lower back pain   . Depression   . ESRD (end stage renal disease) on dialysis [facility])    "MWF; [facility]" (05/16/2016)  . History of blood transfusion 2012   "related to leg OR"  . Hypothyroidism   . On home oxygen therapy    "2-3L when I sleep at night" (05/16/2016)  . OSA (obstructive sleep apnea)    "waiting to get my test done cause I need a new machine" (05/16/2016)  . Pneumonia X 1  . Pulmonary embolism (HCC) 05/15/2016   "small one"  . Type II diabetes mellitus (HCC) dx'd 1981    No family history on file.  SOCIAL HISTORY: Social History   Social History  . Marital status: Married    Spouse name: N/A  . Number of children: N/A  . Years of education: N/A   Occupational History  . Not on file.   Social History Main Topics  . Smoking status: Never Smoker  . Smokeless tobacco: Never Used  . Alcohol use No  . Drug use: No  . Sexual activity: Not Currently   Other Topics Concern  . Not on file   Social History  Narrative  . No narrative on file    Allergies  Allergen Reactions  . Penicillins Hives and Itching    Has patient had a PCN reaction causing immediate rash, facial/tongue/throat swelling, SOB or lightheadedness with hypotension: Yes Has patient had a PCN reaction causing severe rash involving mucus membranes or skin necrosis: No Has patient had a PCN reaction that required hospitalization No Has patient had a PCN reaction occurring within the last 10 years: No If all of the above answers are "NO", then may proceed with Cephalosporin use.   . Sulfa Antibiotics Hives and Itching  . Sulfamethoxazole-Trimethoprim Hives and Itching    Current Facility-Administered Medications  Medication Dose Route Frequency Provider Last Rate Last Dose  . sodium chloride flush (NS) 0.9 % injection 3 mL  3 mL Intravenous PRN Chuck Hint, MD        REVIEW OF SYSTEMS:  [X]  denotes positive finding, [ ]  denotes negative finding Cardiac  Comments:  Chest pain or chest pressure:    Shortness of breath upon exertion:    Short of breath when lying flat:    Irregular heart rhythm:        Vascular    Pain in calf, thigh, or hip brought on by ambulation:    Pain in feet at night that wakes you up from your sleep:  Blood clot in your veins:    Leg swelling:         Pulmonary    Oxygen at home:    Productive cough:     Wheezing:         Neurologic    Sudden weakness in arms or legs:     Sudden numbness in arms or legs:     Sudden onset of difficulty speaking or slurred speech:    Temporary loss of vision in one eye:     Problems with dizziness:         Gastrointestinal    Blood in stool:     Vomited blood:         Genitourinary    Burning when urinating:     Blood in urine:        Psychiatric    Major depression:         Hematologic    Bleeding problems:    Problems with blood clotting too easily:        Skin    Rashes or ulcers:        Constitutional    Fever or  chills:      PHYSICAL EXAM: Vitals:   07/03/16 0917  BP: 115/68  Pulse: (!) 114  Resp: 20  Temp: 98.4 F (36.9 C)  TempSrc: Oral  SpO2: 92%  Weight: 293 lb (132.9 kg)  Height: 6' (1.829 m)    GENERAL: The patient is a well-nourished female, in no acute distress. The vital signs are documented above. CARDIAC: There is a regular rate and rhythm.  VASCULAR: She has a nonfunctioning graft in her right upper arm, right forearm, left upper arm, left forearm. I cannot palpate pedal pulses on the right although she does have a biphasic dorsalis pedis signal on the right. I cannot obtain a posterior tibial signal on the right. PULMONARY: There is good air exchange bilaterally without wheezing or rales. ABDOMEN: Soft and non-tender with normal pitched bowel sounds.  MUSCULOSKELETAL: There are no major deformities or cyanosis. NEUROLOGIC: No focal weakness or paresthesias are detected. SKIN: There are no ulcers or rashes noted. PSYCHIATRIC: The patient has a normal affect.  MEDICAL ISSUES:  END-STAGE RENAL DISEASE: We will obtain a  Venogram on the right. If her central veins are patent on the right she could potentially have a right upper arm graft. If not she will require placement of a right thigh AV graft. She would require preoperative ABIs.    Waverly Ferrari [facility] [place] [phone]

## 2016-07-03 NOTE — Discharge Instructions (Signed)
Venogram, Care After °Refer to this sheet in the next few weeks. These instructions provide you with information on caring for yourself after your procedure. Your health care provider may also give you more specific instructions. Your treatment has been planned according to current medical practices, but problems sometimes occur. Call your health care provider if you have any problems or questions after your procedure. °WHAT TO EXPECT AFTER THE PROCEDURE °After your procedure, it is typical to have the following sensations: °· Mild discomfort at the catheter insertion site. °HOME CARE INSTRUCTIONS  °· Take all medicines exactly as directed. °· Follow any prescribed diet. °· Follow instructions regarding both rest and physical activity. °· Drink more fluids for the first several days after the procedure in order to help flush dye from your kidneys. °SEEK MEDICAL CARE IF: °· You develop a rash. °· You have fever not controlled by medicine. °SEEK IMMEDIATE MEDICAL CARE IF: °· There is pain, drainage, bleeding, redness, swelling, warmth or a red streak at the site of the IV tube. °· The extremity where your IV tube was placed becomes discolored, numb, or cool. °· You have difficulty breathing or shortness of breath. °· You develop chest pain. °· You have excessive dizziness or fainting. °  °This information is not intended to replace advice given to you by your health care provider. Make sure you discuss any questions you have with your health care provider. °  °Document Released: 09/10/2013 Document Revised: 11/25/2013 Document Reviewed: 09/10/2013 °Elsevier Interactive Patient Education ©2016 [employer] Inc. ° °

## 2016-07-03 NOTE — Op Note (Signed)
   PATIENT: Christina Walters  MRN: [id number] DOB: Apr 16, 1971    DATE OF PROCEDURE: 07/03/2016  INDICATIONS: Christina Walters is a 45 y.o. female who is in need of new access. She dialyzes on Monday Wednesdays and Fridays. She has a functioning right IJ ttunneled dialysis catheter.  PROCEDURE: Right central venogram  SURGEON: Di Kindle. Edilia Bo, MD, FACS  ANESTHESIA: none   EBL: minimal  TECHNIQUE: A peripheral IV was started in the right arm. The patient was transferred to the peripheral vascular lab for venogram. Contrast was injected through the IV and the right arm to violate the veins in the right upper extremity and central veins on the right. No immediate complications were noted.  FINDINGS:  1. The right axillary vein, subclavian vein, and right brachiocephalic vein are patent. The right brachiocephalic vein is somewhat small and the catheter may be partially obstructing this.  CLINICAL NOTE: the patient appears to be a potential candidate for right upper arm graft although I have recommended that we attempt to move the catheter to the left side if possible as this could potentially result in venous hypertension. Although the right brachiocephalic vein is somewhat small think it is worth attempting a right arm graft for proceeding to a thigh graft given her obesity and risk for infection.    Waverly Ferrari, MD, FACS [facility]  DATE OF DICTATION:   07/03/2016

## 2016-07-05 ENCOUNTER — Encounter (HOSPITAL_COMMUNITY): Payer: Medicare Other

## 2016-07-05 ENCOUNTER — Other Ambulatory Visit: Payer: Self-pay | Admitting: *Deleted

## 2016-07-05 ENCOUNTER — Other Ambulatory Visit (HOSPITAL_COMMUNITY): Payer: Medicare Other

## 2016-07-05 ENCOUNTER — Ambulatory Visit: Payer: Medicare Other | Admitting: Vascular Surgery

## 2016-07-17 ENCOUNTER — Encounter (HOSPITAL_COMMUNITY): Payer: Self-pay | Admitting: *Deleted

## 2016-07-17 MED ORDER — SODIUM CHLORIDE 0.9 % IV SOLN: INTRAVENOUS | Status: DC

## 2016-07-17 MED ORDER — VANCOMYCIN HCL 10 G IV SOLR
1500.0000 mg | INTRAVENOUS | Status: DC
  Filled 2016-07-17: qty 1500

## 2016-07-17 NOTE — Progress Notes (Signed)
Christina Walters forgot to stop taking Coumadin. I notified Dr Arbie CookeyEarly and he will cancel surgery for tomorrow.  Dr Early asked me to tell patient hold Coumadin and that she will hear from the office tomorrow.

## 2016-07-18 ENCOUNTER — Ambulatory Visit (HOSPITAL_COMMUNITY): Admission: RE | Admit: 2016-07-18 | Payer: Medicare Other | Source: Ambulatory Visit | Admitting: Vascular Surgery

## 2016-07-18 ENCOUNTER — Other Ambulatory Visit: Payer: Self-pay | Admitting: *Deleted

## 2016-07-18 ENCOUNTER — Encounter (HOSPITAL_COMMUNITY): Admission: RE | Payer: Self-pay | Source: Ambulatory Visit

## 2016-07-18 DIAGNOSIS — G4733 Obstructive sleep apnea (adult) (pediatric): Secondary | ICD-10-CM | POA: Diagnosis not present

## 2016-07-18 DIAGNOSIS — I959 Hypotension, unspecified: Secondary | ICD-10-CM | POA: Diagnosis not present

## 2016-07-18 DIAGNOSIS — I4891 Unspecified atrial fibrillation: Secondary | ICD-10-CM | POA: Diagnosis not present

## 2016-07-18 DIAGNOSIS — G2581 Restless legs syndrome: Secondary | ICD-10-CM | POA: Diagnosis not present

## 2016-07-18 DIAGNOSIS — K5900 Constipation, unspecified: Secondary | ICD-10-CM | POA: Diagnosis not present

## 2016-07-18 DIAGNOSIS — Z992 Dependence on renal dialysis: Secondary | ICD-10-CM | POA: Diagnosis not present

## 2016-07-18 DIAGNOSIS — Z9981 Dependence on supplemental oxygen: Secondary | ICD-10-CM | POA: Diagnosis not present

## 2016-07-18 DIAGNOSIS — Z89512 Acquired absence of left leg below knee: Secondary | ICD-10-CM | POA: Diagnosis not present

## 2016-07-18 DIAGNOSIS — Z86711 Personal history of pulmonary embolism: Secondary | ICD-10-CM | POA: Diagnosis not present

## 2016-07-18 DIAGNOSIS — E1142 Type 2 diabetes mellitus with diabetic polyneuropathy: Secondary | ICD-10-CM | POA: Diagnosis not present

## 2016-07-18 DIAGNOSIS — F419 Anxiety disorder, unspecified: Secondary | ICD-10-CM | POA: Diagnosis not present

## 2016-07-18 DIAGNOSIS — F329 Major depressive disorder, single episode, unspecified: Secondary | ICD-10-CM | POA: Diagnosis not present

## 2016-07-18 DIAGNOSIS — Z79899 Other long term (current) drug therapy: Secondary | ICD-10-CM | POA: Diagnosis not present

## 2016-07-18 DIAGNOSIS — E669 Obesity, unspecified: Secondary | ICD-10-CM | POA: Diagnosis not present

## 2016-07-18 DIAGNOSIS — Z7901 Long term (current) use of anticoagulants: Secondary | ICD-10-CM | POA: Diagnosis not present

## 2016-07-18 DIAGNOSIS — E039 Hypothyroidism, unspecified: Secondary | ICD-10-CM | POA: Diagnosis not present

## 2016-07-18 DIAGNOSIS — E1122 Type 2 diabetes mellitus with diabetic chronic kidney disease: Secondary | ICD-10-CM | POA: Diagnosis not present

## 2016-07-18 DIAGNOSIS — N186 End stage renal disease: Secondary | ICD-10-CM | POA: Diagnosis present

## 2016-07-18 DIAGNOSIS — D649 Anemia, unspecified: Secondary | ICD-10-CM | POA: Diagnosis not present

## 2016-07-18 DIAGNOSIS — Z6839 Body mass index (BMI) 39.0-39.9, adult: Secondary | ICD-10-CM | POA: Diagnosis not present

## 2016-07-18 HISTORY — DX: Polyneuropathy, unspecified: G62.9

## 2016-07-18 HISTORY — DX: Constipation, unspecified: K59.00

## 2016-07-18 SURGERY — INSERTION OF ARTERIOVENOUS (AV) GORE-TEX GRAFT ARM
Anesthesia: Choice | Site: Arm Upper | Laterality: Right

## 2016-07-20 ENCOUNTER — Encounter (HOSPITAL_COMMUNITY): Payer: Self-pay | Admitting: *Deleted

## 2016-07-20 ENCOUNTER — Other Ambulatory Visit: Payer: Self-pay | Admitting: *Deleted

## 2016-07-20 NOTE — Progress Notes (Signed)
Spoke with pt for pre-op call. Pt states her last dose of Coumadin was 07/19/16. Pt states she had A-fib "years ago" and she was "shocked" back into rhythm. Denies any other cardiac history, chest pain or sob. Pt is diabetic, states she is no longer on medications. States fasting blood sugar usually is around 90.

## 2016-07-24 MED ORDER — SODIUM CHLORIDE 0.9 % IV SOLN
1500.0000 mg | INTRAVENOUS | Status: AC
  Administered 2016-07-25: 1500 mg via INTRAVENOUS
  Filled 2016-07-24: qty 1500

## 2016-07-24 NOTE — Anesthesia Preprocedure Evaluation (Addendum)
Anesthesia Evaluation  Patient identified by MRN, date of birth, ID band Patient awake    Reviewed: Allergy & Precautions, NPO status , Patient's Chart, lab work & pertinent test results  Airway Mallampati: III  TM Distance: >3 FB Neck ROM: Full    Dental  (+) Teeth Intact, Dental Advisory Given   Pulmonary shortness of breath and Long-Term Oxygen Therapy, sleep apnea , PE   Pulmonary exam normal breath sounds clear to auscultation       Cardiovascular hypertension, negative cardio ROS Normal cardiovascular exam+ dysrhythmias Atrial Fibrillation  Rhythm:Regular Rate:Normal     Neuro/Psych PSYCHIATRIC DISORDERS Anxiety Depression Peripheral neuropathy    GI/Hepatic negative GI ROS, Neg liver ROS,   Endo/Other  diabetes, Type 2Hypothyroidism Obesity   Renal/GU ESRF and DialysisRenal disease (MWF Dialysis)     Musculoskeletal  (+) Arthritis ,   Abdominal   Peds  Hematology  (+) Blood dyscrasia (coumadin therapy for PE), ,   Anesthesia Other Findings Day of surgery medications reviewed with the patient.  Reproductive/Obstetrics                            Anesthesia Physical Anesthesia Plan  ASA: III  Anesthesia Plan: General   Post-op Pain Management:    Induction: Intravenous  Airway Management Planned: LMA  Additional Equipment:   Intra-op Plan:   Post-operative Plan: Extubation in OR  Informed Consent: I have reviewed the patients History and Physical, chart, labs and discussed the procedure including the risks, benefits and alternatives for the proposed anesthesia with the patient or authorized representative who has indicated his/her understanding and acceptance.   Dental advisory given  Plan Discussed with: CRNA  Anesthesia Plan Comments: (Risks/benefits of general anesthesia discussed with patient including risk of damage to teeth, lips, gum, and tongue, nausea/vomiting,  allergic reactions to medications, and the possibility of heart attack, stroke and death.  All patient questions answered.  Patient wishes to proceed.)        Anesthesia Quick Evaluation

## 2016-07-25 ENCOUNTER — Encounter (HOSPITAL_COMMUNITY): Payer: Self-pay | Admitting: Certified Registered"

## 2016-07-25 ENCOUNTER — Ambulatory Visit (HOSPITAL_COMMUNITY)
Admission: RE | Admit: 2016-07-25 | Discharge: 2016-07-25 | Disposition: A | Discharge status: Home / Self Care | Payer: Medicare Other | Source: Ambulatory Visit | Attending: Vascular Surgery | Admitting: Vascular Surgery

## 2016-07-25 ENCOUNTER — Ambulatory Visit (HOSPITAL_COMMUNITY): Payer: Medicare Other | Admitting: Anesthesiology

## 2016-07-25 ENCOUNTER — Ambulatory Visit (HOSPITAL_COMMUNITY): Payer: Medicare Other

## 2016-07-25 ENCOUNTER — Encounter (HOSPITAL_COMMUNITY)
Admission: RE | Disposition: A | Discharge status: Home / Self Care | Payer: Self-pay | Source: Ambulatory Visit | Attending: Vascular Surgery

## 2016-07-25 DIAGNOSIS — E669 Obesity, unspecified: Secondary | ICD-10-CM | POA: Insufficient documentation

## 2016-07-25 DIAGNOSIS — E1122 Type 2 diabetes mellitus with diabetic chronic kidney disease: Secondary | ICD-10-CM | POA: Diagnosis not present

## 2016-07-25 DIAGNOSIS — K5900 Constipation, unspecified: Secondary | ICD-10-CM | POA: Insufficient documentation

## 2016-07-25 DIAGNOSIS — E1142 Type 2 diabetes mellitus with diabetic polyneuropathy: Secondary | ICD-10-CM | POA: Insufficient documentation

## 2016-07-25 DIAGNOSIS — Z86711 Personal history of pulmonary embolism: Secondary | ICD-10-CM | POA: Insufficient documentation

## 2016-07-25 DIAGNOSIS — Z79899 Other long term (current) drug therapy: Secondary | ICD-10-CM | POA: Insufficient documentation

## 2016-07-25 DIAGNOSIS — N185 Chronic kidney disease, stage 5: Secondary | ICD-10-CM | POA: Diagnosis not present

## 2016-07-25 DIAGNOSIS — N186 End stage renal disease: Secondary | ICD-10-CM | POA: Diagnosis not present

## 2016-07-25 DIAGNOSIS — E039 Hypothyroidism, unspecified: Secondary | ICD-10-CM | POA: Insufficient documentation

## 2016-07-25 DIAGNOSIS — Z89512 Acquired absence of left leg below knee: Secondary | ICD-10-CM | POA: Insufficient documentation

## 2016-07-25 DIAGNOSIS — I959 Hypotension, unspecified: Secondary | ICD-10-CM | POA: Insufficient documentation

## 2016-07-25 DIAGNOSIS — G4733 Obstructive sleep apnea (adult) (pediatric): Secondary | ICD-10-CM | POA: Insufficient documentation

## 2016-07-25 DIAGNOSIS — Z992 Dependence on renal dialysis: Secondary | ICD-10-CM | POA: Insufficient documentation

## 2016-07-25 DIAGNOSIS — D649 Anemia, unspecified: Secondary | ICD-10-CM | POA: Insufficient documentation

## 2016-07-25 DIAGNOSIS — Z95828 Presence of other vascular implants and grafts: Secondary | ICD-10-CM

## 2016-07-25 DIAGNOSIS — Z9981 Dependence on supplemental oxygen: Secondary | ICD-10-CM | POA: Insufficient documentation

## 2016-07-25 DIAGNOSIS — Z6839 Body mass index (BMI) 39.0-39.9, adult: Secondary | ICD-10-CM | POA: Insufficient documentation

## 2016-07-25 DIAGNOSIS — F329 Major depressive disorder, single episode, unspecified: Secondary | ICD-10-CM | POA: Insufficient documentation

## 2016-07-25 DIAGNOSIS — F419 Anxiety disorder, unspecified: Secondary | ICD-10-CM | POA: Insufficient documentation

## 2016-07-25 DIAGNOSIS — G2581 Restless legs syndrome: Secondary | ICD-10-CM | POA: Insufficient documentation

## 2016-07-25 DIAGNOSIS — Z7901 Long term (current) use of anticoagulants: Secondary | ICD-10-CM | POA: Insufficient documentation

## 2016-07-25 DIAGNOSIS — I4891 Unspecified atrial fibrillation: Secondary | ICD-10-CM | POA: Insufficient documentation

## 2016-07-25 HISTORY — PX: INSERTION OF DIALYSIS CATHETER: SHX1324

## 2016-07-25 HISTORY — PX: AV FISTULA PLACEMENT: SHX1204

## 2016-07-25 HISTORY — DX: Cardiac arrhythmia, unspecified: I49.9

## 2016-07-25 HISTORY — DX: Anemia, unspecified: D64.9

## 2016-07-25 HISTORY — DX: Restless legs syndrome: G25.81

## 2016-07-25 LAB — POCT I-STAT 4, (NA,K, GLUC, HGB,HCT)
GLUCOSE: 114 mg/dL — AB (ref 65–99)
HEMATOCRIT: 47 % — AB (ref 36.0–46.0)
HEMOGLOBIN: 16 g/dL — AB (ref 12.0–15.0)
Potassium: 4.1 mmol/L (ref 3.5–5.1)
Sodium: 140 mmol/L (ref 135–145)

## 2016-07-25 LAB — APTT: aPTT: 30 seconds (ref 24–36)

## 2016-07-25 LAB — GLUCOSE, CAPILLARY: GLUCOSE-CAPILLARY: 129 mg/dL — AB (ref 65–99)

## 2016-07-25 LAB — HCG, SERUM, QUALITATIVE: Preg, Serum: NEGATIVE

## 2016-07-25 LAB — PROTIME-INR
INR: 1.08
Prothrombin Time: 14 seconds (ref 11.4–15.2)

## 2016-07-25 SURGERY — INSERTION OF ARTERIOVENOUS (AV) GORE-TEX GRAFT ARM
Anesthesia: General | Site: Neck | Laterality: Right

## 2016-07-25 MED ORDER — HEPARIN SODIUM (PORCINE) 1000 UNIT/ML IJ SOLN
INTRAMUSCULAR | Status: DC | PRN
  Administered 2016-07-25: 5000 [IU] via INTRAVENOUS

## 2016-07-25 MED ORDER — HEMOSTATIC AGENTS (NO CHARGE) OPTIME
TOPICAL | Status: DC | PRN
  Administered 2016-07-25: 1 via TOPICAL

## 2016-07-25 MED ORDER — PROPOFOL 10 MG/ML IV BOLUS
INTRAVENOUS | Status: AC
  Filled 2016-07-25: qty 20

## 2016-07-25 MED ORDER — FENTANYL CITRATE (PF) 250 MCG/5ML IJ SOLN
INTRAMUSCULAR | Status: AC
  Filled 2016-07-25: qty 5

## 2016-07-25 MED ORDER — HEPARIN SODIUM (PORCINE) 1000 UNIT/ML IJ SOLN
INTRAMUSCULAR | Status: AC
  Filled 2016-07-25: qty 1

## 2016-07-25 MED ORDER — MIDAZOLAM HCL 2 MG/2ML IJ SOLN
INTRAMUSCULAR | Status: AC
  Filled 2016-07-25: qty 2

## 2016-07-25 MED ORDER — SUCCINYLCHOLINE CHLORIDE 200 MG/10ML IV SOSY
PREFILLED_SYRINGE | INTRAVENOUS | Status: DC | PRN
  Administered 2016-07-25: 100 mg via INTRAVENOUS

## 2016-07-25 MED ORDER — LIDOCAINE-EPINEPHRINE (PF) 1 %-1:200000 IJ SOLN
INTRAMUSCULAR | Status: AC
  Filled 2016-07-25: qty 30

## 2016-07-25 MED ORDER — NEOSTIGMINE METHYLSULFATE 5 MG/5ML IV SOSY
PREFILLED_SYRINGE | INTRAVENOUS | Status: DC | PRN
  Administered 2016-07-25: 5 mg via INTRAVENOUS

## 2016-07-25 MED ORDER — HYDROCODONE-ACETAMINOPHEN 10-325 MG PO TABS: 1.0000 | ORAL_TABLET | Freq: Four times a day (QID) | ORAL | 0 refills | Status: DC | PRN

## 2016-07-25 MED ORDER — PHENYLEPHRINE HCL 10 MG/ML IJ SOLN
INTRAMUSCULAR | Status: DC | PRN
  Administered 2016-07-25: 80 ug via INTRAVENOUS

## 2016-07-25 MED ORDER — WARFARIN SODIUM 5 MG PO TABS: 5.0000 mg | ORAL_TABLET | Freq: Every day | ORAL | 0 refills | Status: DC

## 2016-07-25 MED ORDER — ROCURONIUM BROMIDE 10 MG/ML (PF) SYRINGE
PREFILLED_SYRINGE | INTRAVENOUS | Status: DC | PRN
  Administered 2016-07-25: 40 mg via INTRAVENOUS

## 2016-07-25 MED ORDER — SODIUM CHLORIDE 0.9 % IV SOLN
INTRAVENOUS | Status: DC
  Administered 2016-07-25: 07:00:00 via INTRAVENOUS

## 2016-07-25 MED ORDER — ONDANSETRON HCL 4 MG/2ML IJ SOLN
INTRAMUSCULAR | Status: DC | PRN
  Administered 2016-07-25: 4 mg via INTRAVENOUS

## 2016-07-25 MED ORDER — PROPOFOL 10 MG/ML IV BOLUS
INTRAVENOUS | Status: DC | PRN
  Administered 2016-07-25: 150 mg via INTRAVENOUS

## 2016-07-25 MED ORDER — EPHEDRINE SULFATE-NACL 50-0.9 MG/10ML-% IV SOSY
PREFILLED_SYRINGE | INTRAVENOUS | Status: DC | PRN
  Administered 2016-07-25: 5 mg via INTRAVENOUS
  Administered 2016-07-25: 10 mg via INTRAVENOUS

## 2016-07-25 MED ORDER — PHENYLEPHRINE HCL 10 MG/ML IJ SOLN
INTRAMUSCULAR | Status: AC
  Filled 2016-07-25: qty 1

## 2016-07-25 MED ORDER — CHLORHEXIDINE GLUCONATE CLOTH 2 % EX PADS
6.0000 | MEDICATED_PAD | Freq: Once | CUTANEOUS | Status: DC
Start: 2016-07-25 — End: 2016-07-25

## 2016-07-25 MED ORDER — GLYCOPYRROLATE 0.2 MG/ML IV SOSY
PREFILLED_SYRINGE | INTRAVENOUS | Status: DC | PRN
Start: 2016-07-25 — End: 2016-07-25
  Administered 2016-07-25: 0.6 mg via INTRAVENOUS

## 2016-07-25 MED ORDER — IODIXANOL 320 MG/ML IV SOLN
INTRAVENOUS | Status: DC | PRN
  Administered 2016-07-25: 10 mL via INTRAVENOUS

## 2016-07-25 MED ORDER — GLYCOPYRROLATE 0.2 MG/ML IV SOSY
PREFILLED_SYRINGE | INTRAVENOUS | Status: AC
  Filled 2016-07-25: qty 3

## 2016-07-25 MED ORDER — ROCURONIUM BROMIDE 10 MG/ML (PF) SYRINGE
PREFILLED_SYRINGE | INTRAVENOUS | Status: AC
  Filled 2016-07-25: qty 10

## 2016-07-25 MED ORDER — NEOSTIGMINE METHYLSULFATE 5 MG/5ML IV SOSY
PREFILLED_SYRINGE | INTRAVENOUS | Status: AC
  Filled 2016-07-25: qty 5

## 2016-07-25 MED ORDER — PHENYLEPHRINE HCL 10 MG/ML IJ SOLN
INTRAVENOUS | Status: DC | PRN
  Administered 2016-07-25: 50 ug/min via INTRAVENOUS

## 2016-07-25 MED ORDER — LIDOCAINE 2% (20 MG/ML) 5 ML SYRINGE
INTRAMUSCULAR | Status: AC
  Filled 2016-07-25: qty 5

## 2016-07-25 MED ORDER — SUCCINYLCHOLINE CHLORIDE 200 MG/10ML IV SOSY
PREFILLED_SYRINGE | INTRAVENOUS | Status: AC
  Filled 2016-07-25: qty 10

## 2016-07-25 MED ORDER — SODIUM CHLORIDE 0.9 % IV SOLN
INTRAVENOUS | Status: DC | PRN
  Administered 2016-07-25: 500 mL

## 2016-07-25 MED ORDER — LIDOCAINE HCL (CARDIAC) 20 MG/ML IV SOLN
INTRAVENOUS | Status: DC | PRN
  Administered 2016-07-25: 100 mg via INTRAVENOUS

## 2016-07-25 MED ORDER — HEPARIN SODIUM (PORCINE) 1000 UNIT/ML IJ SOLN
INTRAMUSCULAR | Status: DC | PRN
  Administered 2016-07-25: 3.4 mL via INTRAVENOUS

## 2016-07-25 MED ORDER — 0.9 % SODIUM CHLORIDE (POUR BTL) OPTIME
TOPICAL | Status: DC | PRN
  Administered 2016-07-25: 1000 mL

## 2016-07-25 MED ORDER — FENTANYL CITRATE (PF) 100 MCG/2ML IJ SOLN
INTRAMUSCULAR | Status: DC | PRN
  Administered 2016-07-25: 100 ug via INTRAVENOUS

## 2016-07-25 SURGICAL SUPPLY — 66 items
ARMBAND PINK RESTRICT EXTREMIT (MISCELLANEOUS) ×4 IMPLANT
BAG DECANTER FOR FLEXI CONT (MISCELLANEOUS) IMPLANT
BIOPATCH RED 1 DISK 7.0 (GAUZE/BANDAGES/DRESSINGS) ×3 IMPLANT
BIOPATCH RED 1IN DISK 7.0MM (GAUZE/BANDAGES/DRESSINGS) ×1
CANISTER SUCTION 2500CC (MISCELLANEOUS) ×4 IMPLANT
CATH ANGIO 5F BER2 65CM (CATHETERS) ×4 IMPLANT
CATH PALINDROME RT-P 15FX19CM (CATHETERS) IMPLANT
CATH PALINDROME RT-P 15FX23CM (CATHETERS) ×4 IMPLANT
CATH PALINDROME RT-P 15FX28CM (CATHETERS) IMPLANT
CATH PALINDROME RT-P 15FX55CM (CATHETERS) IMPLANT
CLIP TI MEDIUM 6 (CLIP) ×4 IMPLANT
CLIP TI WIDE RED SMALL 6 (CLIP) ×8 IMPLANT
COVER DOME SNAP 22 D (MISCELLANEOUS) ×4 IMPLANT
COVER PROBE W GEL 5X96 (DRAPES) ×4 IMPLANT
DEVICE TORQUE KENDALL .025-038 (MISCELLANEOUS) ×4 IMPLANT
DRAPE C-ARM 42X72 X-RAY (DRAPES) IMPLANT
DRAPE CHEST BREAST 15X10 FENES (DRAPES) ×4 IMPLANT
ELECT REM PT RETURN 9FT ADLT (ELECTROSURGICAL) ×4
ELECTRODE REM PT RTRN 9FT ADLT (ELECTROSURGICAL) ×2 IMPLANT
GAUZE SPONGE 2X2 8PLY STRL LF (GAUZE/BANDAGES/DRESSINGS) IMPLANT
GAUZE SPONGE 4X4 16PLY XRAY LF (GAUZE/BANDAGES/DRESSINGS) ×8 IMPLANT
GEL ULTRASOUND 20GR AQUASONIC (MISCELLANEOUS) IMPLANT
GLOVE BIO SURGEON STRL SZ 6.5 (GLOVE) ×3 IMPLANT
GLOVE BIO SURGEON STRL SZ7.5 (GLOVE) ×12 IMPLANT
GLOVE BIO SURGEONS STRL SZ 6.5 (GLOVE) ×1
GLOVE BIOGEL PI IND STRL 6.5 (GLOVE) ×6 IMPLANT
GLOVE BIOGEL PI IND STRL 8 (GLOVE) ×2 IMPLANT
GLOVE BIOGEL PI INDICATOR 6.5 (GLOVE) ×6
GLOVE BIOGEL PI INDICATOR 8 (GLOVE) ×2
GOWN STRL REUS W/ TWL LRG LVL3 (GOWN DISPOSABLE) ×2 IMPLANT
GOWN STRL REUS W/ TWL XL LVL3 (GOWN DISPOSABLE) ×6 IMPLANT
GOWN STRL REUS W/TWL LRG LVL3 (GOWN DISPOSABLE) ×2
GOWN STRL REUS W/TWL XL LVL3 (GOWN DISPOSABLE) ×6
GRAFT GORETEX STRT 6X50 (Vascular Products) ×4 IMPLANT
GUIDEWIRE ANGLED .035X150CM (WIRE) ×4 IMPLANT
HEMOSTAT SNOW SURGICEL 2X4 (HEMOSTASIS) IMPLANT
HEMOSTAT SPONGE AVITENE ULTRA (HEMOSTASIS) ×4 IMPLANT
INSERT FOGARTY SM (MISCELLANEOUS) ×4 IMPLANT
KIT BASIN OR (CUSTOM PROCEDURE TRAY) ×4 IMPLANT
KIT ROOM TURNOVER OR (KITS) ×4 IMPLANT
LIQUID BAND (GAUZE/BANDAGES/DRESSINGS) ×8 IMPLANT
NEEDLE 18GX1X1/2 (RX/OR ONLY) (NEEDLE) ×4 IMPLANT
NEEDLE HYPO 25GX1X1/2 BEV (NEEDLE) IMPLANT
NS IRRIG 1000ML POUR BTL (IV SOLUTION) ×4 IMPLANT
PACK CV ACCESS (CUSTOM PROCEDURE TRAY) ×4 IMPLANT
PACK SURGICAL SETUP 50X90 (CUSTOM PROCEDURE TRAY) IMPLANT
PAD ARMBOARD 7.5X6 YLW CONV (MISCELLANEOUS) ×8 IMPLANT
SHEATH COOK PEEL AWAY SET 9F (SHEATH) ×4 IMPLANT
SOAP 2 % CHG 4 OZ (WOUND CARE) ×4 IMPLANT
SPONGE GAUZE 2X2 STER 10/PKG (GAUZE/BANDAGES/DRESSINGS)
SPONGE LAP 18X18 X RAY DECT (DISPOSABLE) ×4 IMPLANT
SUT ETHILON 3 0 PS 1 (SUTURE) ×4 IMPLANT
SUT GORETEX 6.0 TT9 (SUTURE) ×4 IMPLANT
SUT MNCRL AB 4-0 PS2 18 (SUTURE) ×8 IMPLANT
SUT PROLENE 6 0 BV (SUTURE) ×8 IMPLANT
SUT VIC AB 2-0 CT1 27 (SUTURE) ×2
SUT VIC AB 2-0 CT1 TAPERPNT 27 (SUTURE) ×2 IMPLANT
SUT VIC AB 3-0 SH 27 (SUTURE) ×4
SUT VIC AB 3-0 SH 27X BRD (SUTURE) ×4 IMPLANT
SYR 20CC LL (SYRINGE) IMPLANT
SYR 5ML LL (SYRINGE) ×8 IMPLANT
SYR CONTROL 10ML LL (SYRINGE) IMPLANT
SYRINGE 10CC LL (SYRINGE) IMPLANT
UNDERPAD 30X30 INCONTINENT (UNDERPADS AND DIAPERS) ×4 IMPLANT
WATER STERILE IRR 1000ML POUR (IV SOLUTION) IMPLANT
WIRE AMPLATZ SS-J .035X180CM (WIRE) ×8 IMPLANT

## 2016-07-25 NOTE — Transfer of Care (Signed)
Immediate Anesthesia Transfer of Care Note  Patient: Christina Walters  Procedure(s) Performed: Procedure(s): INSERTION OF ARTERIOVENOUS (AV) GORE-TEX GRAFT RIGHT ARM (Right) EXCHANGE RIGHT TO LEFT CHEST DIALYSIS CATHETER (Left)  Patient Location: PACU  Anesthesia Type:General  Level of Consciousness: awake, alert  and oriented  Airway & Oxygen Therapy: Patient Spontanous Breathing and Patient connected to face mask oxygen  Post-op Assessment: Report given to RN, Post -op Vital signs reviewed and stable and Patient moving all extremities X 4  Post vital signs: Reviewed and stable  Last Vitals:  Vitals:   07/25/16 0707  BP: 111/63  Pulse: (!) 111  Resp: 20  Temp: 37.3 C    Last Pain:  Vitals:   07/25/16 0707  TempSrc: Oral  PainSc:       Patients Stated Pain Goal: 2 (07/25/16 [id number])  Complications: No apparent anesthesia complications

## 2016-07-25 NOTE — Op Note (Signed)
OPERATIVE NOTE  PROCEDURE: 1. US guided cannulation left IJ 2. Central venogram 3. Placement 23cm left ij tunneled dialysis catheter 4. Right upper extremity axillary-axillary loop av graft 6mm propatent  PRE-OPERATIVE DIAGNOSIS: end-stage renal failure  POST-OPERATIVE DIAGNOSIS: same as above  SURGEON: Melissa Pulido C. Randie Heinzain, MD  Assistant: Maris BergerKimberly Trinh  ANESTHESIA: general  ESTIMATED BLOOD LOSS: 250 cc   SPECIMEN(S):  none  INDICATIONS:   Christina Walters is a 45 y.o. female who presents with end stage renal disease.  The patient presents for tunneled dialysis catheter placement and right arm av graft placement. The risks, benefits and alternatives to the procedure were described and patient agreed.   DESCRIPTION: After written full informed consent was obtained from the patient, the patient was taken back to the operating room.  Prior to induction, the patient was given IV antibiotics.  After obtaining adequate sedation, the patient was prepped and draped in the standard fashion for a chest or neck tunneled dialysis catheter placement. Under ultrasound guidance, the left internal jugular vein was cannulated with the 18 gauge needle.  A J-wire was then placed down into the right atrium under fluoroscopic guidance.   I attempted to pass the sheath but the the wire bent. AT this time a 479fr sheath was placed and using ber catheter and glide wire we attempted to select the ivc. Central venogram did not demonstrate the ivc. Ultimately we selected the main pulmonary artery and placed an amplatz wire. The skin tract and venotomy was dilated serially with dilators.  Finally, the dilator-sheath was placed under fluoroscopic guidance into the Right atrium over the stiff wire.  The dilator and wire were removed.  A 23 cm Diatek catheter was placed under fluoroscopic guidance down into the right atrium.  The sheath was broken and peeled away while holding the catheter cuff at the level of the  skin.  The back end of this catheter was transected, and docked onto the tunneler.  The distal catheter was delivered through the subcutaneous tunnel.  The catheter was transected a second time, revealing the two lumens of this catheter.  The ports were docked onto these two lumens.  The catheter collar was then snapped into place.  Each port was tested by aspirating and flushing.  No resistance was noted.  Each port was then thoroughly flushed with heparinized saline.  The catheter was secured in placed with two interrupted stitches of 3-0 Nylon tied to the catheter.  The neck incision was closed with a U-stitch of 4-0 Monocryl.  The neck and chest incision were cleaned and sterile bandages applied.  Each port was then loaded with concentrated heparin. Sterile caps were applied to each port.  On completion fluoroscopy, the tips of the catheter were in the right atrium, and there was no evidence of pneumothorax.  We then turned attention to the right arm graft. The patient was prepped and draped in standard fashion for a left arm access procedure.  I turned my attention first to the axilla.   I made an longitudinal incision over the axillary artery and vein and dissected down through the subcutaneous tissue and  fascia carefully and was able to dissect out the both vessels. I took a Company secretarymetal Gore tunneler and dissected from the axillary incision to an area lateral and placed a counter incision.  Then I delivered the 6-mm stretch Gore-Tex graft, through this metal tunneler and then pulled out the metal tunneler leaving the graft in place.  I then  dissected from the axillary to the counter incision with the metal Gore tunneler and deliver the graft through the metal tunnel.  I removed the tunnel, leaving the graft in place. I then gave the patient 5000 units of heparin to gain anticoagulation.  The venous end was sewn first in an end to side manner with gore suture. After flushing, the graft was clamped and the  arterial side sewn in similar manner.   Prior to completing the anastomosis, I allowed the vein to back bleed and then I also allowed the artery to bleed in an antegrade fashion.  I completed this anastomosis in the usual fashion, and then irrigated out the high bicipital exposure and obtained hemostasis. The distal radial artery was dopplerable although monophasic.   The venous outflow had a flow signature consistent with widely patent arteriovenous graft. The subcutaneous tissue in the axillary incision was reapproximated with a running stitch of 2-0 Vicryl.  The skin was then reapproximated with a running subcuticular 4-0 Monocryl and the counter incision closed similarly.  The skin was then cleaned, dried, and glue used to reinforce the skin closure. The patient was allowed to awaken from anesthesia having tolerated the procedure well.    COMPLICATIONS: none  CONDITION: good  Christina Walters C. Randie Heinzain, MD [facility] Office: [phone] Pager: [phone]   07/25/2016, 10:32 AM

## 2016-07-25 NOTE — Anesthesia Procedure Notes (Signed)
Date/Time: 07/25/2016 7:39 AM Performed by: Lanell MatarBAKER, Teal Bontrager M Pre-anesthesia Checklist: Patient identified, Emergency Drugs available, Suction available, Patient being monitored and Timeout performed Patient Re-evaluated:Patient Re-evaluated prior to inductionOxygen Delivery Method: Circle system utilized Preoxygenation: Pre-oxygenation with 100% oxygen Intubation Type: IV induction Ventilation: Mask ventilation without difficulty and Oral airway inserted - appropriate to patient size Laryngoscope Size: Mac and 4 Grade View: Grade III Tube type: Oral Tube size: 7.5 mm Number of attempts: 2 Airway Equipment and Method: Stylet Placement Confirmation: ETT inserted through vocal cords under direct vision,  positive ETCO2,  CO2 detector and breath sounds checked- equal and bilateral Secured at: 26 cm Tube secured with: Tape Dental Injury: Teeth and Oropharynx as per pre-operative assessment  Comments: DL with Miller 3..grade 4 view.  DL with MAC 4 -grade 3 view. AOI.

## 2016-07-25 NOTE — Anesthesia Postprocedure Evaluation (Signed)
Anesthesia Post Note  Patient: Christina Walters  Procedure(s) Performed: Procedure(s) (LRB): INSERTION OF ARTERIOVENOUS (AV) GORE-TEX GRAFT RIGHT ARM (Right) EXCHANGE RIGHT TO LEFT CHEST DIALYSIS CATHETER (Left)  Patient location during evaluation: PACU Anesthesia Type: General Level of consciousness: awake and alert Pain management: pain level controlled Vital Signs Assessment: post-procedure vital signs reviewed and stable Respiratory status: spontaneous breathing, nonlabored ventilation, respiratory function stable and patient connected to nasal cannula oxygen Cardiovascular status: blood pressure returned to baseline and stable Postop Assessment: no signs of nausea or vomiting Anesthetic complications: no    Last Vitals:  Vitals:   07/25/16 1145 07/25/16 1155  BP:  130/65  Pulse: (!) 103   Resp: (!) 22 20  Temp: 37.2 C     Last Pain:  Vitals:   07/25/16 1155  TempSrc:   PainSc: 0-No pain                 Cecile HearingStephen Edward Turk

## 2016-07-25 NOTE — H&P (Signed)
H+P    History of Present Illness: Christina Walters is a 45 y.o. female, who I saw in consultation on 04/12/2016. She has had forearm and upper arm grafts on both sides. Most recently, [facility]. This has occluded. She did have a previous left thigh AV graft which had to be removed because of infection. She's never had a right thigh AV graft. 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 She dialyzes on Monday Wednesdays and Fridays. She has a functioning tunneled dialysis catheter.  She denies any recent uremic symptoms. Specifically she denies nausea, vomiting, fatigue, anorexia, or palpitations.  Recent vein mapping by Dr. Edilia Boickson demonstrates adequate axillary vein for creation of right upper arm av graft placement.  Past Medical History:  Diagnosis Date  . Anemia   . Anxiety   . Arthritis    "left hand" (05/16/2016)  . Chronic lower back pain   . Constipation   . Depression   . Dysrhythmia    irregular heart rate - A- fib   . ESRD (end stage renal disease) on dialysis [facility](HCC)    "MWF; [facility]" (05/16/2016)  . History of blood transfusion 2012   "related to leg OR"  . Hypertension    years ago, now has hypotension  . Hypothyroidism   . Neuropathy (HCC)   . On home oxygen therapy    "2-3L when I sleep at night" (05/16/2016)  . OSA (obstructive sleep apnea)    "waiting to get my test done cause I need a new machine" (05/16/2016)using O2  . Pneumonia X 1  . Pulmonary embolism (HCC) 05/15/2016   "small one"  . Pulmonary embolism (HCC) 2017   on Coumadin  . Restless legs syndrome   . Shortness of breath dyspnea    with back pain  . Type II diabetes mellitus (HCC) dx'd 1981   controlled, not on medications    Past Surgical History:  Procedure Laterality Date  . ARTERIOVENOUS GRAFT PLACEMENT Bilateral   . BELOW KNEE LEG AMPUTATION Left 2012  . DEBRIDMENT OF DECUBITUS  ULCER Bilateral ~ 2011   "hips"  . DILATION AND CURETTAGE OF UTERUS    . ESOPHAGOGASTRODUODENOSCOPY    . INCISION AND DRAINAGE OF WOUND Right ~ 2011   "bubble mass in my thigh"  . PERIPHERAL VASCULAR CATHETERIZATION N/A 07/03/2016   Procedure: Upper Extremity Venography;  Surgeon: Chuck Hinthristopher S Dickson, MD;  [facility];  Service: Cardiovascular;  Laterality: N/A;  . THROMBECTOMY / ARTERIOVENOUS GRAFT REVISION Bilateral     Allergies  Allergen Reactions  . Penicillins Hives and Itching    Has patient had a PCN reaction causing immediate rash, facial/tongue/throat swelling, SOB or lightheadedness with hypotension: Yes Has patient had a PCN reaction causing severe rash involving mucus membranes or skin necrosis: No Has patient had a PCN reaction that required hospitalization No Has patient had a PCN reaction occurring within the last 10 years: No If all of the above answers are "NO", then may proceed with Cephalosporin use.   . Sulfa Antibiotics Hives and Itching  . Sulfamethoxazole-Trimethoprim Hives and Itching    Prior to Admission medications   Medication Sig Start Date End Date Taking? Authorizing Provider  cinacalcet (SENSIPAR) 30 MG tablet Take 30 mg by mouth daily with breakfast.    Yes Historical Provider, MD  cyclobenzaprine (FLEXERIL) 10 MG tablet Take 10 mg by mouth 3 (  three) times daily as needed for muscle spasms.  03/29/15  Yes Historical Provider, MD  Docusate Sodium 100 MG capsule Take 100 mg by mouth 2 (two) times daily as needed for constipation.  10/30/12  Yes Historical Provider, MD  DULoxetine (CYMBALTA) 30 MG capsule Take 30 mg by mouth daily. Take with a 60 mg capsule for a 90 mg dose 05/07/16  Yes Historical Provider, MD  DULoxetine (CYMBALTA) 60 MG capsule Take 60 mg by mouth daily. Take with a 30 mg capsule for a 90 mg dose 07/06/16  Yes Historical Provider, MD  Ferric Citrate (AURYXIA) 1 GM 210 MG(Fe) TABS Take 2 g by mouth 3 (three) times daily.    Yes Historical Provider, MD  gabapentin (NEURONTIN) 100 MG capsule Take 300 mg by mouth 3 (three) times daily.   Yes Historical Provider, MD  gabapentin (NEURONTIN) 600 MG tablet Take 300 mg by mouth 3 (three) times daily.  04/16/15  Yes Historical Provider, MD  HYDROcodone-acetaminophen (NORCO) 10-325 MG tablet Take 1 tablet by mouth every 6 (six) hours as needed for moderate pain.  06/26/16  Yes Historical Provider, MD  hydrOXYzine (ATARAX/VISTARIL) 50 MG tablet Take 50 mg by mouth 3 (three) times daily as needed for anxiety or itching.  11/03/15  Yes Historical Provider, MD  levothyroxine (SYNTHROID, LEVOTHROID) 88 MCG tablet Take 88 mcg by mouth daily before breakfast.  08/27/13  Yes Historical Provider, MD  midodrine (PROAMATINE) 5 MG tablet Take 5 mg by mouth 2 (two) times daily with a meal.    Yes Historical Provider, MD  mirtazapine (REMERON) 7.5 MG tablet Take 7.5 mg by mouth at bedtime.  04/28/14  Yes Historical Provider, MD  multivitamin (RENA-VIT) TABS tablet Take 1 tablet by mouth at bedtime. 05/22/16  Yes Dorothea OgleIskra M Myers, MD  OXYGEN Inhale 2 L into the lungs at bedtime.   Yes Historical Provider, MD  traZODone (DESYREL) 50 MG tablet Take 50 mg by mouth at bedtime as needed for sleep.   Yes Historical Provider, MD  trolamine salicylate (ASPERCREME) 10 % cream Apply 1 application topically daily as needed for muscle pain.   Yes Historical Provider, MD  warfarin (COUMADIN) 5 MG tablet Take 1.5 tablets (7.5 mg total) by mouth one time only at 6 PM. Patient taking differently: Take 5 mg by mouth daily at 6 PM.  05/22/16   Dorothea OgleIskra M Myers, MD    Social History   Social History  . Marital status: Married    Spouse name: N/A  . Number of children: N/A  . Years of education: N/A   Occupational History  . Not on file.   Social History Main Topics  . Smoking status: Never Smoker  . Smokeless tobacco: Never Used  . Alcohol use No  . Drug use: No  . Sexual activity: Not Currently   Other  Topics Concern  . Not on file   Social History Narrative  . No narrative on file     Family History  Problem Relation Age of Onset  . Hypertension Mother   . Kidney Stones Mother   . Arthritis Mother   . Non-Hodgkin's lymphoma Father     ROS: [x]  Positive   [ ]  Negative   [ ]  All sytems reviewed and are negative  Cardiovascular: []  chest pain/pressure []  palpitations []  SOB lying flat []  DOE []  pain in legs while walking []  pain in legs at rest []  pain in legs at night []  non-healing ulcers [x]  hx of DVT []   swelling in legs  Pulmonary: []  productive cough []  asthma/wheezing []  home O2  Neurologic: []  weakness in []  arms []  legs []  numbness in []  arms []  legs []  hx of CVA []  mini stroke [] difficulty speaking or slurred speech []  temporary loss of vision in one eye []  dizziness  Hematologic: []  hx of cancer []  bleeding problems []  problems with blood clotting easily  Endocrine:   []  diabetes []  thyroid disease  GI []  vomiting blood []  blood in stool  GU: [x]  CKD/renal failure [x]  HD--[x]  M/W/F or []  T/T/S []  burning with urination []  blood in urine  Psychiatric: []  anxiety []  depression  Musculoskeletal: []  arthritis []  joint pain  Integumentary: []  rashes []  ulcers  Constitutional: []  fever []  chills   Physical Examination GENERAL: The patient is a well-nourished female, in no acute distress. The vital signs are documented above. CARDIAC: There is a regular rate and rhythm.  VASCULAR: She has a nonfunctioning graft in her right upper arm, right forearm, left upper arm, left forearm. I cannot palpate pedal pulses on the right although she does have a biphasic dorsalis pedis signal on the right. I cannot obtain a posterior tibial signal on the right.  Palpable R radial pulse PULMONARY: There is good air exchange bilaterally without wheezing or rales. ABDOMEN: Soft and non-tender with normal pitched bowel sounds.  MUSCULOSKELETAL: There  are no major deformities or cyanosis. NEUROLOGIC: No focal weakness or paresthesias are detected. SKIN: There are no ulcers or rashes noted. PSYCHIATRIC: The patient has a normal affect.    Component Value Date/Time   WBC 8.7 06/04/2016 0205   RBC 4.11 06/04/2016 0205   HGB 16.0 (H) 07/25/2016 0650   HCT 47.0 (H) 07/25/2016 0650   PLT 367 06/04/2016 0205   MCV 91.5 06/04/2016 0205   MCH 27.7 06/04/2016 0205   MCHC 30.3 06/04/2016 0205   RDW 16.7 (H) 06/04/2016 0205   LYMPHSABS 2.0 06/04/2016 0205   MONOABS 0.9 06/04/2016 0205   EOSABS 0.3 06/04/2016 0205   BASOSABS 0.0 06/04/2016 0205    BMET    Component Value Date/Time   NA 140 07/25/2016 0650   K 4.1 07/25/2016 0650   CL 96 (L) 07/03/2016 0948   CO2 24 06/04/2016 0205   GLUCOSE 114 (H) 07/25/2016 0650   BUN 40 (H) 07/03/2016 0948   CREATININE 11.50 (H) 07/03/2016 0948   CALCIUM 9.2 06/04/2016 0205   GFRNONAA 5 (L) 06/04/2016 0205   GFRAA 6 (L) 06/04/2016 0205    COAGS: Lab Results  Component Value Date   INR 2.01 07/03/2016   INR 2.70 (H) 06/04/2016   INR 2.13 (H) 05/22/2016       ASSESSMENT/PLAN: This is a 45 y.o. female with multiple failed accesses, recent vein mapping demonstrates adequate axillary vein for graft in R arm. Will also move tdc to left side.   Val Schiavo C. Randie Heinz, MD [facility] [place] Office: [phone] Pager: [phone]

## 2016-07-26 ENCOUNTER — Encounter (HOSPITAL_COMMUNITY): Payer: Self-pay | Admitting: Vascular Surgery

## 2016-07-26 ENCOUNTER — Telehealth: Payer: Self-pay | Admitting: Vascular Surgery

## 2016-07-26 NOTE — Telephone Encounter (Signed)
Sched appt 9/22 at 9:00. Spoke to  Pt to inform them of appt.

## 2016-07-26 NOTE — Telephone Encounter (Signed)
-----   Message from Sharee PimpleMarilyn K McChesney, RN sent at 07/25/2016 11:21 AM EDT ----- Regarding: schedule 4 weeks postop   ----- Message ----- From: Maeola HarmanBrandon Christopher Cain, MD Sent: 07/25/2016  10:47 AM To: [id number] Charge Pool  Christina Walters [id number] 10-19-1971  PROCEDURE: 1. US guided cannulation left IJ 2. Central venogram 3. Placement 23cm left ij tunneled dialysis catheter 4. Right upper extremity axillary-axillary loop av graft 6mm propatent  PRE-OPERATIVE DIAGNOSIS: end-stage renal failure  SURGEON: Brandon C. Randie Heinzain, MD  Assistant: Maris BergerKimberly Trinh  F/u in 4 weeks

## 2016-08-15 ENCOUNTER — Ambulatory Visit: Payer: Medicare Other | Admitting: Osteopathic Medicine

## 2016-08-21 ENCOUNTER — Encounter: Payer: Self-pay | Admitting: Vascular Surgery

## 2016-08-21 ENCOUNTER — Ambulatory Visit: Payer: Medicare Other | Admitting: Osteopathic Medicine

## 2016-08-22 ENCOUNTER — Ambulatory Visit: Payer: Medicare Other | Admitting: Osteopathic Medicine

## 2016-08-24 ENCOUNTER — Ambulatory Visit (INDEPENDENT_AMBULATORY_CARE_PROVIDER_SITE_OTHER): Payer: Medicare Other | Admitting: Family Medicine

## 2016-08-24 ENCOUNTER — Telehealth: Payer: Self-pay | Admitting: Family Medicine

## 2016-08-24 ENCOUNTER — Other Ambulatory Visit: Payer: Self-pay | Admitting: Family Medicine

## 2016-08-24 ENCOUNTER — Encounter: Payer: Self-pay | Admitting: Family Medicine

## 2016-08-24 VITALS — HR 94 | Temp 97.5°F | Ht 72.0 in | Wt 309.8 lb

## 2016-08-24 DIAGNOSIS — R252 Cramp and spasm: Secondary | ICD-10-CM

## 2016-08-24 DIAGNOSIS — I951 Orthostatic hypotension: Secondary | ICD-10-CM | POA: Diagnosis not present

## 2016-08-24 DIAGNOSIS — Z86711 Personal history of pulmonary embolism: Secondary | ICD-10-CM | POA: Diagnosis not present

## 2016-08-24 DIAGNOSIS — Z1231 Encounter for screening mammogram for malignant neoplasm of breast: Secondary | ICD-10-CM

## 2016-08-24 DIAGNOSIS — G4733 Obstructive sleep apnea (adult) (pediatric): Secondary | ICD-10-CM | POA: Diagnosis not present

## 2016-08-24 DIAGNOSIS — Z1239 Encounter for other screening for malignant neoplasm of breast: Secondary | ICD-10-CM

## 2016-08-24 DIAGNOSIS — L299 Pruritus, unspecified: Secondary | ICD-10-CM

## 2016-08-24 HISTORY — DX: Morbid (severe) obesity due to excess calories: E66.01

## 2016-08-24 MED ORDER — HYDROXYZINE HCL 50 MG PO TABS: 50.0000 mg | ORAL_TABLET | Freq: Three times a day (TID) | ORAL | 2 refills | Status: DC | PRN

## 2016-08-24 NOTE — Telephone Encounter (Signed)
...   [facility] [address] if provider would like for her to continue taking.

## 2016-08-24 NOTE — Progress Notes (Signed)
Chief Complaint  Patient presents with  . Establish Care    pt stated having some dizziness x 2 1/2 weeks-(on and off)-she feels it when she stands and sit up.       New Patient Visit SUBJECTIVE: HPI: Christina Walters is an [age] y.o.female who is being seen for establishing care.  The patient was previously seen at [facility] and transferred because she did not believe they were managing her PT/INR adequately.  Intermittent dizziness over the past 2-2.5 weeks. She gets it whenever she rises from a laying or seated position. MWF gets HD, things they may take too much off. She is on 10 mg midodrine TID. She is compliant. No issues with rolling over or turning her head.  She also gets cramping in her hands when she has dialysis. No injury, affects her entire hand, not the joints.  Allergies  Allergen Reactions  . Penicillins Hives and Itching    Has patient had a PCN reaction causing immediate rash, facial/tongue/throat swelling, SOB or lightheadedness with hypotension: Yes Has patient had a PCN reaction causing severe rash involving mucus membranes or skin necrosis: No Has patient had a PCN reaction that required hospitalization No Has patient had a PCN reaction occurring within the last 10 years: No If all of the above answers are "NO", then may proceed with Cephalosporin use.   . Sulfa Antibiotics Hives and Itching  . Sulfamethoxazole-Trimethoprim Hives and Itching    Past Medical History:  Diagnosis Date  . Anemia   . Anxiety   . Arthritis    "left hand" (05/16/2016)  . Chronic lower back pain   . Constipation   . Depression   . Dysrhythmia    irregular heart rate - A- fib   . ESRD (end stage renal disease) on dialysis [facility](HCC)    "MWF; [facility]" (05/16/2016)  . History of blood transfusion 2012   "related to leg OR"  . Hypertension    years ago, now has hypotension  . Hypothyroidism   . Morbid obesity (HCC) 08/24/2016  . Neuropathy (HCC)   . On home oxygen therapy     "2-3L when I sleep at night" (05/16/2016)  . OSA (obstructive sleep apnea)    "waiting to get my test done cause I need a new machine" (05/16/2016)using O2  . Pulmonary embolism (HCC) 05/15/2016   "small one"  . Pulmonary embolism (HCC) 2017   on Coumadin  . Restless legs syndrome   . Type II diabetes mellitus (HCC) dx'd 1981   controlled, not on medications   Past Surgical History:  Procedure Laterality Date  . ARTERIOVENOUS GRAFT PLACEMENT Bilateral   . AV FISTULA PLACEMENT Right 07/25/2016   Procedure: INSERTION OF ARTERIOVENOUS (AV) GORE-TEX GRAFT RIGHT ARM;  Surgeon: Maeola HarmanBrandon Christopher Cain, MD;  [facility];  Service: Vascular;  Laterality: Right;  . BELOW KNEE LEG AMPUTATION Left 2012  . DEBRIDMENT OF DECUBITUS ULCER Bilateral ~ 2011   "hips"  . DILATION AND CURETTAGE OF UTERUS    . ESOPHAGOGASTRODUODENOSCOPY    . INCISION AND DRAINAGE OF WOUND Right ~ 2011   "bubble mass in my thigh"  . INSERTION OF DIALYSIS CATHETER Left 07/25/2016   Procedure: EXCHANGE RIGHT TO LEFT CHEST DIALYSIS CATHETER;  Surgeon: Maeola HarmanBrandon Christopher Cain, MD;  [facility];  Service: Vascular;  Laterality: Left;  . PERIPHERAL VASCULAR CATHETERIZATION N/A 07/03/2016   Procedure: Upper Extremity Venography;  Surgeon: Chuck Hinthristopher S Dickson, MD;  [facility];  Service: Cardiovascular;  Laterality: N/A;  . THROMBECTOMY / ARTERIOVENOUS GRAFT REVISION Bilateral    Social History   Social History  . Marital status: Married   Social History Main Topics  . Smoking status: Never Smoker  . Smokeless tobacco: Never Used  . Alcohol use No  . Drug use: No  . Sexual activity: Not Currently   Family History  Problem Relation Age of Onset  . Hypertension Mother   . Kidney Stones Mother   . Arthritis Mother   . Non-Hodgkin's lymphoma Father      Current Outpatient Prescriptions:  .  cinacalcet (SENSIPAR) 30 MG tablet, Take 30 mg by mouth daily with breakfast. , Disp: , Rfl:  .   cyclobenzaprine (FLEXERIL) 10 MG tablet, Take 10 mg by mouth 3 (three) times daily as needed for muscle spasms. , Disp: , Rfl:  .  Docusate Sodium 100 MG capsule, Take 100 mg by mouth 2 (two) times daily as needed for constipation. , Disp: , Rfl:  .  DULoxetine (CYMBALTA) 30 MG capsule, Take 30 mg by mouth daily. Take with a 60 mg capsule for a 90 mg dose, Disp: , Rfl:  .  DULoxetine (CYMBALTA) 60 MG capsule, Take 60 mg by mouth daily. Take with a 30 mg capsule for a 90 mg dose, Disp: , Rfl:  .  Ferric Citrate (AURYXIA) 1 GM 210 MG(Fe) TABS, Take 2 g by mouth 3 (three) times daily., Disp: , Rfl:  .  gabapentin (NEURONTIN) 100 MG capsule, Take 300 mg by mouth 3 (three) times daily., Disp: , Rfl:  .  HYDROcodone-acetaminophen (NORCO) 10-325 MG tablet, Take 1 tablet by mouth every 6 (six) hours as needed for moderate pain., Disp: 20 tablet, Rfl: 0 .  hydrOXYzine (ATARAX/VISTARIL) 50 MG tablet, Take 1 tablet (50 mg total) by mouth 3 (three) times daily as needed for anxiety or itching., Disp: 30 tablet, Rfl: 2 .  levothyroxine (SYNTHROID, LEVOTHROID) 88 MCG tablet, Take 88 mcg by mouth daily before breakfast. , Disp: , Rfl:  .  mirtazapine (REMERON) 7.5 MG tablet, Take 7.5 mg by mouth at bedtime. , Disp: , Rfl:  .  multivitamin (RENA-VIT) TABS tablet, Take 1 tablet by mouth at bedtime., Disp: 30 tablet, Rfl: 0 .  OXYGEN, Inhale 2 L into the lungs at bedtime., Disp: , Rfl:  .  traZODone (DESYREL) 50 MG tablet, Take 50 mg by mouth at bedtime as needed for sleep., Disp: , Rfl:  .  trolamine salicylate (ASPERCREME) 10 % cream, Apply 1 application topically daily as needed for muscle pain., Disp: , Rfl:  .  warfarin (COUMADIN) 5 MG tablet, Take 1 tablet (5 mg total) by mouth daily at 6 PM., Disp: 30 tablet, Rfl: 0 .  gabapentin (NEURONTIN) 600 MG tablet, Take 300 mg by mouth 3 (three) times daily. , Disp: , Rfl:  .  midodrine (PROAMATINE) 10 MG tablet, Take 1 tablet by mouth 3 (three) times daily., Disp: ,  Rfl:   Patient's last menstrual period was 08/21/2016 (exact date).  ROS Eyes: Denies vision changes  Neuro: +Dizziness   OBJECTIVE: Pulse 94   Temp 97.5 F (36.4 C) (Oral)   Ht 6' (1.829 m)   Wt (!) 309 lb 12.8 oz (140.5 kg)   LMP 08/21/2016 (Exact Date)   SpO2 97%   BMI 42.02 kg/m   Constitutional: -  VS reviewed -  Well developed, well nourished, appears stated age -  No apparent distress  Psychiatric: -  Oriented to person, place, and  time -  Memory intact -  Affect and mood normal -  Fluent conversation, good eye contact -  Judgment and insight age appropriate  Eye: -  Conjunctivae clear, no discharge -  Pupils symmetric, round, reactive to light  ENMT: -  Ears are patent b/l without erythema or discharge. TM's are shiny and clear b/l without evidence of effusion or infection. -  Oral mucosa without lesions, tongue and uvula midline    Tonsils not enlarged, no erythema, no exudate, trachea midline    Pharynx moist, no lesions, no erythema  Neck: -  No gross swelling, no palpable masses -  Thyroid midline, not enlarged, mobile, no palpable masses  Cardiovascular: -  Irreg rhythm, reg rate when I listened, no murmurs -  No LE edema  Respiratory: -  Normal respiratory effort, no accessory muscle use, no retraction -  Breath sounds equal, no wheezes, no ronchi, no crackles  Musculoskeletal: -  BKA noted on L, prosthetic in place  Skin: -  Chest port on L present -  Warm and dry to palpation   ASSESSMENT/PLAN: Orthostasis  Cramping of hands  History of pulmonary embolism  Morbid obesity due to excess calories (HCC)  OSA (obstructive sleep apnea)  Screening for breast cancer  Pruritus - Plan: hydrOXYzine (ATARAX/VISTARIL) 50 MG tablet  Patient instructed to sign release of records form from her previous PCP. Info given regarding orthostasis. Drink a little more and keep an eye on symptoms, if worsening, go to ED. Do not want to fluid overload the ESRD pt.   For her hand cramping, will see what her previous labs have shown. Will also see how she does when she is better hydrated. She receives Flexeril and Norco from pain management. I will not rx these medications on a chronic basis. Patient should return pending her records from previous PCP. The patient voiced understanding and agreement to the plan.   Jilda Roche [place], DO 08/24/16  3:08 PM

## 2016-08-24 NOTE — Telephone Encounter (Signed)
At check out pt inquired about a Rx for her medication hydrOXYzine. Pt would like to

## 2016-08-24 NOTE — Patient Instructions (Addendum)

## 2016-08-24 NOTE — Progress Notes (Signed)
Pre visit review using our clinic review tool, if applicable. No additional management support is needed unless otherwise documented below in the visit note. 

## 2016-08-25 ENCOUNTER — Ambulatory Visit: Payer: Medicare Other | Admitting: Vascular Surgery

## 2016-08-25 ENCOUNTER — Encounter: Payer: Self-pay | Admitting: Vascular Surgery

## 2016-08-25 NOTE — Telephone Encounter (Signed)
Rx sent.//AB/CMA

## 2016-08-31 LAB — PROTIME-INR

## 2016-09-01 ENCOUNTER — Other Ambulatory Visit (HOSPITAL_COMMUNITY): Payer: Medicare Other

## 2016-09-01 ENCOUNTER — Ambulatory Visit: Payer: Medicare Other | Admitting: Vascular Surgery

## 2016-09-01 ENCOUNTER — Ambulatory Visit (INDEPENDENT_AMBULATORY_CARE_PROVIDER_SITE_OTHER): Payer: Medicare Other | Admitting: Vascular Surgery

## 2016-09-01 ENCOUNTER — Encounter (HOSPITAL_COMMUNITY): Payer: Medicare Other

## 2016-09-01 VITALS — BP 82/49 | HR 105 | Temp 97.2°F | Resp 16 | Ht 72.0 in | Wt 295.0 lb

## 2016-09-01 DIAGNOSIS — N186 End stage renal disease: Secondary | ICD-10-CM

## 2016-09-01 NOTE — Progress Notes (Signed)
Subjective:     Patient ID: Christina Walters, female   DOB: 03/23/1971, 45 y.o.   MRN: [id number]  HPI Mr. Merrilee Janskyndrade returns from recent placement of right upper extremity axillary loop graft and relocation of tunnel catheter to the left IJ with central venogram. 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 She does still have pain in her right hand although it is no worse since our most recent procedure. She continues to dialyze via the IJ catheter has not uses a graft yet. Denies fevers incisions are healed well.   Review of Systems  Constitutional: Negative.   Cardiovascular: Negative.   Gastrointestinal: Negative.   Musculoskeletal:       Right hand numbness       Objective:   Physical Exam  Bruit in R arm av graft Monophasic R radial signal, biphasic ulnar artery signal Left bka     Assessment:    45 year old female recently placed right upper extremity AV loop graft and left IJ tunneled dialysis catheter which she is dialyzing currently.    Plan:    okay to use right upper extremity AV loop graft at this time. I counseled her that given her  Hypotension this graft may be difficult to access incomplete dialysis and so she will be catheter dependent for the meantime. She understands this and understands her limited options for future dialysis access. She can follow-up in our office when necessary.  Cass Edinger C. Randie Heinzain, MD [facility] Office: [phone] Pager: [phone]

## 2016-09-01 NOTE — Progress Notes (Signed)
Vitals:   09/01/16 0951  BP: (!) 82/52  Pulse: (!) 108  Resp: 16  Temp: 97.2 F (36.2 C)  SpO2: 100%  Weight: 295 lb (133.8 kg)  Height: 6' (1.829 m)

## 2016-09-07 ENCOUNTER — Telehealth: Payer: Self-pay | Admitting: Internal Medicine

## 2016-09-07 NOTE — Telephone Encounter (Signed)
Patient is calling to let Dr. Carmelia RollerWendling know that when she was at her dialysis treatment yesterday the technician thought that her blood was thicker than normal. She is wondering what her last INR was and would like to know if she needs to increase her warfarin. Please advise   Patient Relation: self Patient phone: [phone]

## 2016-09-11 NOTE — Telephone Encounter (Signed)
Called and spoke with NorwaySh[place]ia with [facility] in [place]([phone]),regarding the pt's recent INR results.  Last INR was done on (08/30/16) and the INR:1.32 and PT:16.1.  Pt takes Warfarin 5mg -Take 1 tablet by mouth daily at 6 pm.   Informed Dr. Carmelia RollerWendling of results and directions.  Per Dr. Carmelia RollerWendling have the pt to take 7 1/2mg (1 1/2 tablets) on M,W,F and recheck INR in 1 week.  If the pt has taken the 5mg  today, then do 7 1/2mg (1 1/2 tablets) on T, Th, Sat.  Spoke with the pt and informed her of the note above.  Pt verbalized understanding and agreed.  Pt stated that she has not taking the Warfarin for today,so she will start with the 7 1/2mg  today.  Pt was scheduled a nurse visit to recheck INR for (Wed-09/20/16 @ 3:30pm).  While on the phone pt requested a refill on Cyclobenzaprine 10mg .  Spoke with Dr. Carmelia RollerWendling regarding the refill and he stated that the Cyclobenzaprine is not used for long term.  If she feels she needs it for long term we will need to send her to pain mangement.//AB/CMA

## 2016-09-11 NOTE — Telephone Encounter (Signed)
[facility] @ [phone]) asking the pt to RTC regarding medication request.//AB/CMA

## 2016-09-12 ENCOUNTER — Inpatient Hospital Stay (HOSPITAL_BASED_OUTPATIENT_CLINIC_OR_DEPARTMENT_OTHER): Admission: RE | Admit: 2016-09-12 | Payer: Self-pay | Source: Ambulatory Visit

## 2016-09-14 ENCOUNTER — Other Ambulatory Visit: Payer: Self-pay | Admitting: Family Medicine

## 2016-09-14 MED ORDER — TRAZODONE HCL 50 MG PO TABS: 50.0000 mg | ORAL_TABLET | Freq: Every evening | ORAL | 0 refills | Status: DC | PRN

## 2016-09-14 MED ORDER — CYCLOBENZAPRINE HCL 10 MG PO TABS: 5.0000 mg | ORAL_TABLET | Freq: Three times a day (TID) | ORAL | 0 refills | Status: DC | PRN

## 2016-09-14 NOTE — Telephone Encounter (Addendum)
Called and spoke with the pt and informed her that per Dr. Carmelia RollerWendling the Cyclobenzaprine is not to be used for long term, so he does not prescript it for that.  Pt verbalized understanding.   Asked the pt what she was taking the Cyclobenzaprine and the Trazodone for and she stated that she has a ruptured disc in her back at the 3-4 and 5-6 vertebra in her neck.  She also have nerve pain in her hands.  She uses the Trazodone for sleep.   Informed Dr. Carmelia RollerWendling of what the pt said.  He stated if the pt has not gone to pain management yet he will refill the prescription, but if she has then she will need to call pain management.  Spoke with the pt and she stated that she is seeing pain management in [place](Janice Orsani,[place]), and she decreased the Cyclobenzaprine to 5mg .  Pt stated that she is having to double up on the 5mg  because it's not strong enough.  She has called the office but has not heard back from them.  Informed Dr. Carmelia RollerWendling of this and he agreed to refill the both medications this time but the pt will need to get in touch with pain management.  Informed the pt of this and she agreed.  Informed the pt that I be only refilling the #30 for the Cyclobenzaprine and #60 for the Trazodone.  Pt verbalized understanding and agreed.  She stated that she will be trying to contact pain management again.//AB/CMA

## 2016-09-14 NOTE — Telephone Encounter (Signed)
Caller name: Relationship to patient: Self Can be reached: [phone] [facility] [address] [place], [place] - [address] [phone] ([phone])     Reason for call: Refill cyclobenzaprine (FLEXERIL) 10 MG tablet [[id number]][[id number]] and  traZODone (DESYREL) 50 MG tablet [[id number]  Please call patient when Rx have been called in.

## 2016-09-14 NOTE — Telephone Encounter (Signed)
See telephone encounter for (09/14/16).//AB/CMA

## 2016-09-20 ENCOUNTER — Ambulatory Visit: Payer: Self-pay

## 2016-09-28 LAB — PROTIME-INR

## 2016-10-06 ENCOUNTER — Telehealth: Payer: Self-pay | Admitting: Family Medicine

## 2016-10-06 NOTE — Telephone Encounter (Signed)
Relation to ZO:XWRUpt:self Call back number: Pharmacy:  Reason for call:  Patient would like to schedule INR check with nurse, please advise. Patient scheduled follow up appointment with PCP Thursday 10/12/16 to discuss medication.

## 2016-10-11 NOTE — Telephone Encounter (Signed)
Yes

## 2016-10-11 NOTE — Telephone Encounter (Signed)
Can we check this at her visit scheduled 10/12/2016. TL/CMA

## 2016-10-11 NOTE — Telephone Encounter (Signed)
I have alerted patient that we will check INR at 10/11/2016. TL/CMA

## 2016-10-12 ENCOUNTER — Ambulatory Visit: Payer: Self-pay | Admitting: Family Medicine

## 2016-10-12 NOTE — Telephone Encounter (Signed)
I have alerted the patient that she would need to come in for a lab visit. Pt has been scheduled 10/13/16 for POCT INR. Pt understands.TL/CMA

## 2016-10-13 ENCOUNTER — Ambulatory Visit (INDEPENDENT_AMBULATORY_CARE_PROVIDER_SITE_OTHER): Payer: Medicare Other | Admitting: Behavioral Health

## 2016-10-13 DIAGNOSIS — Z86711 Personal history of pulmonary embolism: Secondary | ICD-10-CM

## 2016-10-13 LAB — POCT INR: INR: 3.4

## 2016-10-13 NOTE — Patient Instructions (Addendum)
Per Dr. Carmelia RollerWendling: Take Coumadin 5 mg daily, except take 7.5 mg on Tuesdays. Recheck INR in 1 week, 10/19/16.

## 2016-10-13 NOTE — Progress Notes (Signed)
Pre visit review using our clinic review tool, if applicable. No additional management support is needed unless otherwise documented below in the visit note.  Patient presents in clinic for INR check. Reviewed medication & regimen with the patient. No positive findings were reported. Today's INR reading was 3.4.  Per Dr. Carmelia RollerWendling: Take Coumadin 5 mg daily, except take 7.5 mg on Tuesdays. Recheck INR in 1 week, 10/19/16.  Informed patient of the provider's instructions. She verbalized understanding and did not have any further questions or concerns prior to leaving the nurse visit.

## 2016-10-19 ENCOUNTER — Ambulatory Visit: Payer: Self-pay | Admitting: Family Medicine

## 2016-10-19 DIAGNOSIS — Z0289 Encounter for other administrative examinations: Secondary | ICD-10-CM

## 2016-10-19 LAB — PROTIME-INR: INR: 2.7 — AB (ref 0.9–1.1)

## 2016-10-20 ENCOUNTER — Telehealth: Payer: Self-pay | Admitting: *Deleted

## 2016-10-20 ENCOUNTER — Encounter: Payer: Self-pay | Admitting: Family Medicine

## 2016-10-20 ENCOUNTER — Telehealth: Payer: Self-pay | Admitting: Family Medicine

## 2016-10-20 NOTE — Telephone Encounter (Signed)
Patient was No Show 11/16. Previous No Show 11/9. Charge or No Charge?

## 2016-10-20 NOTE — Telephone Encounter (Signed)
Charge. Ty.

## 2016-10-20 NOTE — Telephone Encounter (Signed)
Called and spoke with the pt and informed her that we [facility].  Informed the pt that per Dr. Carmelia RollerWendling the INR results look good and to continue the same does of Coumadin.  Pt verbalized understanding and agreed.  Informed the pt that she does not need to have her INR done at both the kidney center and here.  Pt verbalized understanding and agreed.//AB/CMA

## 2016-10-23 ENCOUNTER — Telehealth: Payer: Self-pay | Admitting: Family Medicine

## 2016-10-23 NOTE — Telephone Encounter (Signed)
Please advise. I have informed pt we will be unable to fill Rx due to another prescriber writing and following her INR. Pt stated kidney doctor told her they would not refill Rx for INR and she says that she needs a refill. TL/CMA

## 2016-10-23 NOTE — Telephone Encounter (Signed)
OK to call in. Should probably hold off on rechecking until next week since she has been out. TY.

## 2016-10-23 NOTE — Telephone Encounter (Signed)
Patient is calling requesting a refill of warfarin (COUMADIN) 5 MG tablet. Patient is out of this medication. Please advise.    [facility] [address] - [place], [place] - [id number]

## 2016-10-23 NOTE — Telephone Encounter (Signed)
Pt states she ran out of Rx on Saturday and has not had any doseages since then. INR done last week at kidney doctor and was normal. Pt scheduled tomorrow for recheck INR nurse visit.TL/CMA

## 2016-10-24 ENCOUNTER — Ambulatory Visit: Payer: Self-pay

## 2016-11-08 ENCOUNTER — Telehealth: Payer: Self-pay

## 2016-11-08 NOTE — Telephone Encounter (Signed)
Phone call from pt. Reported continued pain and numbness in right hand.  Stated it is present day and night.  Reported the Dialysis center is using the right arm AVG; stated "the pain/numbness is not worse, but is not getting better."  Reported the right hand gets cold at times.  Stated is able to grip with the right hand, but the dexterity to button a blouse or put earrings on is not very good.  Stated the Gabapentin doesn't give much relief.  Requested an appt. with Dr. Randie Heinzain to re-evaluate.  Advised will have Scheduler call with appt.  Pt. Agreed.

## 2016-11-09 NOTE — Telephone Encounter (Signed)
Sched appt 12/06/16 at 3:00. Spoke to pt to inform them of appt.

## 2016-11-16 ENCOUNTER — Other Ambulatory Visit (HOSPITAL_BASED_OUTPATIENT_CLINIC_OR_DEPARTMENT_OTHER): Payer: Self-pay | Admitting: Psychiatry

## 2016-11-16 ENCOUNTER — Ambulatory Visit: Payer: Self-pay | Admitting: Family Medicine

## 2016-11-16 ENCOUNTER — Ambulatory Visit (INDEPENDENT_AMBULATORY_CARE_PROVIDER_SITE_OTHER): Payer: Medicare Other | Admitting: Family Medicine

## 2016-11-16 ENCOUNTER — Ambulatory Visit (HOSPITAL_BASED_OUTPATIENT_CLINIC_OR_DEPARTMENT_OTHER)
Admission: RE | Admit: 2016-11-16 | Discharge: 2016-11-16 | Disposition: A | Discharge status: Home / Self Care | Payer: Medicare Other | Source: Ambulatory Visit | Attending: Psychiatry | Admitting: Psychiatry

## 2016-11-16 ENCOUNTER — Encounter: Payer: Self-pay | Admitting: Family Medicine

## 2016-11-16 VITALS — BP 98/58 | HR 62 | Temp 98.1°F | Resp 18 | Ht 72.0 in | Wt 304.0 lb

## 2016-11-16 DIAGNOSIS — G8929 Other chronic pain: Secondary | ICD-10-CM

## 2016-11-16 DIAGNOSIS — Z86711 Personal history of pulmonary embolism: Secondary | ICD-10-CM | POA: Diagnosis not present

## 2016-11-16 DIAGNOSIS — M1288 Other specific arthropathies, not elsewhere classified, other specified site: Secondary | ICD-10-CM | POA: Diagnosis not present

## 2016-11-16 DIAGNOSIS — G4733 Obstructive sleep apnea (adult) (pediatric): Secondary | ICD-10-CM

## 2016-11-16 DIAGNOSIS — M545 Low back pain, unspecified: Secondary | ICD-10-CM

## 2016-11-16 DIAGNOSIS — G5603 Carpal tunnel syndrome, bilateral upper limbs: Secondary | ICD-10-CM

## 2016-11-16 DIAGNOSIS — M5146 Schmorl's nodes, lumbar region: Secondary | ICD-10-CM | POA: Insufficient documentation

## 2016-11-16 MED ORDER — DICLOFENAC SODIUM 1 % TD GEL: TRANSDERMAL | 3 refills | Status: AC

## 2016-11-16 MED ORDER — GABAPENTIN 300 MG PO CAPS: ORAL_CAPSULE | ORAL | 2 refills | Status: DC

## 2016-11-16 MED ORDER — WARFARIN SODIUM 5 MG PO TABS: 5.0000 mg | ORAL_TABLET | Freq: Every day | ORAL | 0 refills | Status: DC

## 2016-11-16 NOTE — Progress Notes (Signed)
Chief Complaint  Patient presents with  . Follow-up    need referral for neuro, gyn, and pain.  needs a small scooter  . Medication Refill    gabapentin and warfarin  . PHQ-9 score 9    Subjective: Patient is a 45 y.o. female here for referral request.  Sees pain management- [facility]  Hand pain R handed, b/l hand pain, all over for around 3 years, getting worse. Describes as sharp, dull, aching and burning. Palliation: Neurontin helps, heat Provocation: Cold  +numbness and tingling in her first 3 digits b/l. She is dropping things. Her mother had CTS.  ROS: MSK- + chronic low back pain [place]- + numbness and tingling in hands  Family History  Problem Relation Age of Onset  . Hypertension Mother   . Kidney Stones Mother   . Arthritis Mother   . Non-Hodgkin's lymphoma Father    Past Medical History:  Diagnosis Date  . Anemia   . Anxiety   . Arthritis    "left hand" (05/16/2016)  . Chronic lower back pain   . Constipation   . Depression   . Dysrhythmia    irregular heart rate - A- fib   . ESRD (end stage renal disease) on dialysis [facility](HCC)    "MWF; [facility]" (05/16/2016)  . History of blood transfusion 2012   "related to leg OR"  . Hypertension    years ago, now has hypotension  . Hypothyroidism   . Morbid obesity (HCC) 08/24/2016  . Neuropathy (HCC)   . On home oxygen therapy    "2-3L when I sleep at night" (05/16/2016)  . OSA (obstructive sleep apnea)    "waiting to get my test done cause I need a new machine" (05/16/2016)using O2  . Pulmonary embolism (HCC) 05/15/2016   "small one"  . Pulmonary embolism (HCC) 2017   on Coumadin  . Restless legs syndrome   . Type II diabetes mellitus (HCC) dx'd 1981   controlled, not on medications   Allergies  Allergen Reactions  . Penicillins Hives and Itching    Has patient had a PCN reaction causing immediate rash, facial/tongue/throat swelling, SOB or  lightheadedness with hypotension: Yes Has patient had a PCN reaction causing severe rash involving mucus membranes or skin necrosis: No Has patient had a PCN reaction that required hospitalization No Has patient had a PCN reaction occurring within the last 10 years: No If all of the above answers are "NO", then may proceed with Cephalosporin use.   . Sulfa Antibiotics Hives and Itching  . Sulfamethoxazole-Trimethoprim Hives and Itching    Current Outpatient Prescriptions:  .  cinacalcet (SENSIPAR) 30 MG tablet, Take 30 mg by mouth daily with breakfast. , Disp: , Rfl:  .  Docusate Sodium 100 MG capsule, Take 100 mg by mouth 2 (two) times daily as needed for constipation. , Disp: , Rfl:  .  DULoxetine (CYMBALTA) 30 MG capsule, Take 30 mg by mouth daily. Take with a 60 mg capsule for a 90 mg dose, Disp: , Rfl:  .  DULoxetine (CYMBALTA) 60 MG capsule, Take 60 mg by mouth daily. Take with a 30 mg capsule for a 90 mg dose, Disp: , Rfl:  .  HYDROcodone-acetaminophen (NORCO) 10-325 MG tablet, Take 1 tablet by mouth every 6 (six) hours as needed for moderate pain., Disp: 20 tablet, Rfl: 0 .  hydrOXYzine (ATARAX/VISTARIL) 50 MG tablet, Take 1 tablet (50 mg total) by mouth 3 (  three) times daily as needed for anxiety or itching., Disp: 30 tablet, Rfl: 2 .  levothyroxine (SYNTHROID, LEVOTHROID) 88 MCG tablet, Take 88 mcg by mouth daily before breakfast. , Disp: , Rfl:  .  midodrine (PROAMATINE) 10 MG tablet, Take 1 tablet by mouth 3 (three) times daily., Disp: , Rfl:  .  mirtazapine (REMERON) 7.5 MG tablet, Take 7.5 mg by mouth at bedtime. , Disp: , Rfl:  .  multivitamin (RENA-VIT) TABS tablet, Take 1 tablet by mouth at bedtime., Disp: 30 tablet, Rfl: 0 .  OXYGEN, Inhale 2 L into the lungs at bedtime., Disp: , Rfl:  .  traZODone (DESYREL) 50 MG tablet, Take 1 tablet (50 mg total) by mouth at bedtime as needed for sleep., Disp: 60 tablet, Rfl: 0 .  trolamine salicylate (ASPERCREME) 10 % cream, Apply 1  application topically daily as needed for muscle pain., Disp: , Rfl:  .  warfarin (COUMADIN) 5 MG tablet, Take 1 tablet (5 mg total) by mouth daily at 6 PM., Disp: 30 tablet, Rfl: 0 .  diclofenac sodium (VOLTAREN) 1 % GEL, Apply to affected area on back three times daily as needed., Disp: 1 Tube, Rfl: 3 .  gabapentin (NEURONTIN) 300 MG capsule, 1 cap in AM, 1 cap in afternoon, 2 caps in evening., Disp: 120 capsule, Rfl: 2 .  Naloxone HCl 0.4 MG/ML SOCT, , Disp: , Rfl:  .  oxyCODONE-acetaminophen (PERCOCET/ROXICET) 5-325 MG tablet, , Disp: , Rfl:  .  tiZANidine (ZANAFLEX) 2 MG tablet, , Disp: , Rfl:   Objective: BP (!) 98/58 (BP Location: Right Arm, Cuff Size: Large)   Pulse 62   Temp 98.1 F (36.7 C) (Oral)   Resp 18   Ht 6' (1.829 m)   Wt (!) 304 lb (137.9 kg)   LMP 11/16/2016   SpO2 95%   BMI 41.23 kg/m  General: Awake, appears stated age HEENT: MMM, EOMi Heart: RRR, no murmurs, no bruits Lungs: CTAB, no rales, wheezes or rhonchi. No accessory muscle use Neuro: +Tinel's on L and R, more pronounced on R; sensation intact to light touch b/l; neg Phalen's. Msk: +TTP in lumbar paraspinal musculature, worse on the R. She is nonambulatory. No muscle group  Psych: Age appropriate judgment and insight, normal affect and mood  Assessment and Plan: Chronic midline low back pain without sciatica - Plan: Ambulatory referral to Pain Clinic, diclofenac sodium (VOLTAREN) 1 % GEL  Bilateral carpal tunnel syndrome - Plan: gabapentin (NEURONTIN) 300 MG capsule  OSA (obstructive sleep apnea) - Plan: Ambulatory referral to Pulmonology  History of pulmonary embolism - Plan: warfarin (COUMADIN) 5 MG tablet  Orders as above.  Topical NSAID OK despite renal function. Would like steroid injections in spine instead of just trigger point injections. 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 I do not believe she requires a neuro referral at this time. B/l wrist splints given, to wear at night. OK for  the pt to continue to use gabapentin for symptoms.  Pt has hx of OSA and scheduled appt with sleep specialist, needs referral for insurance purposes. Her nephrologist has been drawing INR's during HD and deferring further management to me. I am not pleased with this practice, but I would like to see the most recent draw from a few days ago. She was subtherapeutic at her most recent check, but had not been taking her medication as she had run out.  F/u in 1 mo to recheck CTS. If she is still having issues, will refer  to hand surgery. I am discharging her for frequent no-shows, but will continue to manage Coumadin until she can be set up with someone else. The patient voiced understanding and agreement to the plan.  Jilda Roche [place], DO 11/16/16  4:10 PM

## 2016-11-16 NOTE — Progress Notes (Signed)
Pre visit review using our clinic review tool, if applicable. No additional management support is needed unless otherwise documented below in the visit note. 

## 2016-11-23 ENCOUNTER — Encounter: Payer: Self-pay | Admitting: Vascular Surgery

## 2016-12-06 ENCOUNTER — Ambulatory Visit: Payer: Medicare Other | Admitting: Vascular Surgery

## 2016-12-11 ENCOUNTER — Encounter: Payer: Self-pay | Admitting: Vascular Surgery

## 2016-12-14 ENCOUNTER — Telehealth: Payer: Self-pay | Admitting: Family Medicine

## 2016-12-14 ENCOUNTER — Ambulatory Visit (INDEPENDENT_AMBULATORY_CARE_PROVIDER_SITE_OTHER): Payer: Self-pay | Admitting: Family Medicine

## 2016-12-14 DIAGNOSIS — Z0289 Encounter for other administrative examinations: Secondary | ICD-10-CM

## 2016-12-14 NOTE — Telephone Encounter (Signed)
Please advise.//AB/CMA 

## 2016-12-14 NOTE — Telephone Encounter (Signed)
Caller name: Relationship to patient: Self Can be reached: [phone]  Pharmacy:  Reason for call: Patient request Rx for a new Left Below Knee Prosthetic. States she will pick it up on Monday at her appt.

## 2016-12-15 ENCOUNTER — Ambulatory Visit: Payer: Medicare Other | Admitting: Vascular Surgery

## 2016-12-18 ENCOUNTER — Ambulatory Visit (INDEPENDENT_AMBULATORY_CARE_PROVIDER_SITE_OTHER): Payer: Medicare Other | Admitting: Family Medicine

## 2016-12-18 ENCOUNTER — Telehealth: Payer: Self-pay | Admitting: Family Medicine

## 2016-12-18 ENCOUNTER — Telehealth: Payer: Self-pay | Admitting: *Deleted

## 2016-12-18 VITALS — BP 132/76 | HR 60 | Temp 98.1°F | Wt 316.0 lb

## 2016-12-18 DIAGNOSIS — L299 Pruritus, unspecified: Secondary | ICD-10-CM | POA: Diagnosis not present

## 2016-12-18 DIAGNOSIS — Z86711 Personal history of pulmonary embolism: Secondary | ICD-10-CM | POA: Diagnosis not present

## 2016-12-18 DIAGNOSIS — G894 Chronic pain syndrome: Secondary | ICD-10-CM

## 2016-12-18 DIAGNOSIS — G5603 Carpal tunnel syndrome, bilateral upper limbs: Secondary | ICD-10-CM | POA: Diagnosis not present

## 2016-12-18 MED ORDER — HYDROXYZINE HCL 50 MG PO TABS: 50.0000 mg | ORAL_TABLET | Freq: Three times a day (TID) | ORAL | 2 refills | Status: DC | PRN

## 2016-12-18 MED ORDER — DULOXETINE HCL 30 MG PO CPEP: 30.0000 mg | ORAL_CAPSULE | Freq: Every day | ORAL | 2 refills | Status: AC

## 2016-12-18 MED ORDER — DULOXETINE HCL 60 MG PO CPEP: 60.0000 mg | ORAL_CAPSULE | Freq: Every day | ORAL | 2 refills | Status: AC

## 2016-12-18 MED ORDER — WARFARIN SODIUM 5 MG PO TABS: 5.0000 mg | ORAL_TABLET | Freq: Every day | ORAL | 0 refills | Status: DC

## 2016-12-18 MED ORDER — GABAPENTIN 300 MG PO CAPS: ORAL_CAPSULE | ORAL | 2 refills | Status: AC

## 2016-12-18 MED ORDER — TIZANIDINE HCL 4 MG PO TABS: 4.0000 mg | ORAL_TABLET | Freq: Four times a day (QID) | ORAL | 0 refills | Status: DC | PRN

## 2016-12-18 NOTE — Patient Instructions (Addendum)
Talk to your OB/GYN about possible contraception. DO NOT get pregnant while you are taking warfarin.  If you do not hear anything about your referral in 1-2 weeks, call our office to ask for an update.  Warfarin: Take 5 mg daily except for Sunday and Thursday. You will take 7.5 mg (1.5 tabs) on Sunday and Thursday.

## 2016-12-18 NOTE — Telephone Encounter (Signed)
Let's start the discharge process for frequent no-shows. I thought this was done, but I do not see any documentation. TY.

## 2016-12-18 NOTE — Telephone Encounter (Signed)
Patient was No Show 1/11. Previous No Show 12/14 and 11/9. Charge or No Charge?

## 2016-12-18 NOTE — Progress Notes (Signed)
Chief Complaint  Patient presents with  . Follow-up    Subjective: Patient is a 46 y.o. female here for fu CTS b/l.  Pt dx'd with CTS b/l 4 weeks ago. This has been going on for around 3 years. She was given gabapentin in addition to night splints and stretches. She does not notice any difference. Has reports compliance with medications and braces. She was intermittently doing the stretches. Pain continues to be worse at night. She is still dropping things.  ROS: Neuro: As noted in HPI  Family History  Problem Relation Age of Onset  . Hypertension Mother   . Kidney Stones Mother   . Arthritis Mother   . Non-Hodgkin's lymphoma Father    Past Medical History:  Diagnosis Date  . Anemia   . Anxiety   . Arthritis    "left hand" (05/16/2016)  . Chronic lower back pain   . Constipation   . Depression   . Dysrhythmia    irregular heart rate - A- fib   . ESRD (end stage renal disease) on dialysis [facility])    "MWF; [facility]" (05/16/2016)  . History of blood transfusion 2012   "related to leg OR"  . Hypertension    years ago, now has hypotension  . Hypothyroidism   . Morbid obesity (HCC) 08/24/2016  . Neuropathy (HCC)   . On home oxygen therapy    "2-3L when I sleep at night" (05/16/2016)  . OSA (obstructive sleep apnea)    "waiting to get my test done cause I need a new machine" (05/16/2016)using O2  . Pulmonary embolism (HCC) 05/15/2016   "small one"  . Pulmonary embolism (HCC) 2017   on Coumadin  . Restless legs syndrome   . Type II diabetes mellitus (HCC) dx'd 1981   controlled, not on medications   Allergies  Allergen Reactions  . Penicillins Hives and Itching    Has patient had a PCN reaction causing immediate rash, facial/tongue/throat swelling, SOB or lightheadedness with hypotension: Yes Has patient had a PCN reaction causing severe rash involving mucus membranes or skin necrosis: No Has patient had a PCN reaction that required hospitalization No Has patient had a  PCN reaction occurring within the last 10 years: No If all of the above answers are "NO", then may proceed with Cephalosporin use.   . Sulfa Antibiotics Hives and Itching  . Sulfamethoxazole-Trimethoprim Hives and Itching    Current Outpatient Prescriptions:  .  cinacalcet (SENSIPAR) 30 MG tablet, Take 30 mg by mouth daily with breakfast. , Disp: , Rfl:  .  diclofenac sodium (VOLTAREN) 1 % GEL, Apply to affected area on back three times daily as needed., Disp: 1 Tube, Rfl: 3 .  Docusate Sodium 100 MG capsule, Take 100 mg by mouth 2 (two) times daily as needed for constipation. , Disp: , Rfl:  .  DULoxetine (CYMBALTA) 30 MG capsule, Take 1 capsule (30 mg total) by mouth daily. Take with a 60 mg capsule for a 90 mg dose, Disp: 30 capsule, Rfl: 2 .  DULoxetine (CYMBALTA) 60 MG capsule, Take 1 capsule (60 mg total) by mouth daily. Take with a 30 mg capsule for a 90 mg dose, Disp: 30 capsule, Rfl: 2 .  gabapentin (NEURONTIN) 300 MG capsule, 1 cap in AM, 1 cap in afternoon, 2 caps in evening., Disp: 120 capsule, Rfl: 2 .  HYDROcodone-acetaminophen (NORCO) 10-325 MG tablet, Take 1 tablet by mouth every 6 (six) hours as needed for moderate pain., Disp: 20 tablet, Rfl:  0 .  hydrOXYzine (ATARAX/VISTARIL) 50 MG tablet, Take 1 tablet (50 mg total) by mouth 3 (three) times daily as needed for anxiety or itching., Disp: 30 tablet, Rfl: 2 .  levothyroxine (SYNTHROID, LEVOTHROID) 88 MCG tablet, Take 88 mcg by mouth daily before breakfast. , Disp: , Rfl:  .  midodrine (PROAMATINE) 10 MG tablet, Take 1 tablet by mouth 3 (three) times daily., Disp: , Rfl:  .  mirtazapine (REMERON) 7.5 MG tablet, Take 7.5 mg by mouth at bedtime. , Disp: , Rfl:  .  multivitamin (RENA-VIT) TABS tablet, Take 1 tablet by mouth at bedtime., Disp: 30 tablet, Rfl: 0 .  Naloxone HCl 0.4 MG/ML SOCT, , Disp: , Rfl:  .  OXYGEN, Inhale 2 L into the lungs at bedtime., Disp: , Rfl:  .  traZODone (DESYREL) 50 MG tablet, Take 1 tablet (50 mg  total) by mouth at bedtime as needed for sleep., Disp: 60 tablet, Rfl: 0 .  trolamine salicylate (ASPERCREME) 10 % cream, Apply 1 application topically daily as needed for muscle pain., Disp: , Rfl:  .  warfarin (COUMADIN) 5 MG tablet, Take 1 tablet (5 mg total) by mouth daily at 6 PM., Disp: 30 tablet, Rfl: 0 .  tiZANidine (ZANAFLEX) 4 MG tablet, Take 1 tablet (4 mg total) by mouth every 6 (six) hours as needed for muscle spasms., Disp: 30 tablet, Rfl: 0  Objective: BP 132/76 (BP Location: Left Arm, Patient Position: Sitting, Cuff Size: Normal)   Pulse 60   Temp 98.1 F (36.7 C) (Oral)   Wt (!) 316 lb (143.3 kg)   BMI 42.86 kg/m  General: Awake, appears stated age Heart: RRR, no murmurs Lungs: CTAB, no rales, wheezes or rhonchi. No accessory muscle use MSK: Grip strength adequate, no muscle group atrophy or asymmetry Neuro: +Phalen's b/l, +Tinel's b/l Psych: Age appropriate judgment and insight, normal affect and mood  Assessment and Plan: Bilateral carpal tunnel syndrome - Plan: gabapentin (NEURONTIN) 300 MG capsule, Ambulatory referral to Hand Surgery  History of pulmonary embolism - Plan: warfarin (COUMADIN) 5 MG tablet  Pruritus - Plan: hydrOXYzine (ATARAX/VISTARIL) 50 MG tablet  Chronic pain syndrome - Plan: DULoxetine (CYMBALTA) 30 MG capsule, DULoxetine (CYMBALTA) 60 MG capsule, tiZANidine (ZANAFLEX) 4 MG tablet  Orders as above. I would next proceed with injections, so will refer to hand. Continue stretches and using splints. New pain management appt this Fri, OK to refill Zanaflex. Pt also asked for rx for below the knee prosthetic. [place] and stated [place] PCP can do this. I'm find with this. F/u prn. The patient voiced understanding and agreement to the plan.  Jilda Rocheicholas Paul WildomarWendling, DO 12/18/16  4:47 PM

## 2016-12-18 NOTE — Telephone Encounter (Signed)
Called patient and left message to return call

## 2016-12-18 NOTE — Progress Notes (Signed)
Pre visit review using our clinic review tool, if applicable. No additional management support is needed unless otherwise documented below in the visit note. 

## 2016-12-22 ENCOUNTER — Telehealth: Payer: Self-pay | Admitting: Family Medicine

## 2016-12-22 ENCOUNTER — Other Ambulatory Visit: Payer: Self-pay

## 2016-12-22 MED ORDER — HYDROCODONE-ACETAMINOPHEN 10-325 MG PO TABS: 1.0000 | ORAL_TABLET | Freq: Four times a day (QID) | ORAL | 0 refills | Status: AC | PRN

## 2016-12-22 NOTE — Telephone Encounter (Signed)
Please advise. TL/CMA 

## 2016-12-22 NOTE — Telephone Encounter (Signed)
self Call back number:[phone] Pharmacy:  Reason for call: pt states that she was suppose to have an appointment today with pain management at 1:45, states she called pain management and was informed that their system was down and she had to call them back at 2:30 today, pt states it does not seem as thought she is going to be able to see the doctor today, pt's concern is she is completely out of meds and she does not know when she will be able to get back in for her appt with the doctor. Wants to know if Dr. Carmelia RollerWendling can provide her pain medication until she can get in to see the doctor.

## 2016-12-22 NOTE — Telephone Encounter (Signed)
Discharge letter created and given to [place]. TL/CMA

## 2016-12-22 NOTE — Telephone Encounter (Signed)
Per Dr. Carmelia Rollerwendling recommendations will refill Rx just this one time.  #20 tablets #0 refills.  TL/CMA

## 2016-12-28 ENCOUNTER — Other Ambulatory Visit: Payer: Self-pay | Admitting: Family Medicine

## 2016-12-28 DIAGNOSIS — G894 Chronic pain syndrome: Secondary | ICD-10-CM

## 2016-12-28 MED ORDER — TIZANIDINE HCL 4 MG PO TABS: 4.0000 mg | ORAL_TABLET | Freq: Four times a day (QID) | ORAL | 0 refills | Status: DC | PRN

## 2016-12-28 NOTE — Telephone Encounter (Signed)
Advise on this request for refill on pain meds.

## 2016-12-28 NOTE — Telephone Encounter (Signed)
Patient is calling to follow up on this request. Please advise.

## 2016-12-28 NOTE — Telephone Encounter (Signed)
Refill done.  

## 2016-12-28 NOTE — Telephone Encounter (Signed)
A rx was printed on 12/22/16 for Norco. TY.

## 2016-12-28 NOTE — Telephone Encounter (Signed)
OK to refill. TY.  

## 2016-12-28 NOTE — Telephone Encounter (Signed)
Relation to ZO:XWRUpt:self Call back number: Pharmacy: [facility] [address] - [place], [place] - [address] [phone] (Phone) [phone] (Fax)     Reason for call:  Patient wanted to inform PCP pain managment was closed due to the snow and the system was down patient has an appointment scheduled but will run out of tiZANidine (ZANAFLEX) 4 MG tablet requesting a refill due to muscle spasms, please advise

## 2016-12-29 LAB — PROTIME-INR: INR: 1.7 — AB (ref 0.9–1.1)

## 2017-01-03 ENCOUNTER — Telehealth: Payer: Self-pay | Admitting: Family Medicine

## 2017-01-03 NOTE — Telephone Encounter (Signed)
Patient dismissed from [facility] by Arva ChafeNicholas Wendling DO effective December 22, 2016. Dismissal letter sent out by certified / registered mail.  DAJ

## 2017-01-08 ENCOUNTER — Ambulatory Visit (INDEPENDENT_AMBULATORY_CARE_PROVIDER_SITE_OTHER): Payer: Medicare Other | Admitting: Orthopaedic Surgery

## 2017-01-08 ENCOUNTER — Other Ambulatory Visit: Payer: Self-pay | Admitting: Family Medicine

## 2017-01-08 NOTE — Telephone Encounter (Signed)
Patient is request a call with a reason as to why she was dismissed. Please advise  Phone: [phone]

## 2017-01-10 NOTE — Telephone Encounter (Signed)
Called patient. No answer. Unable to leave a voice mail.  Mailbox full.  Will try again later.

## 2017-01-15 NOTE — Telephone Encounter (Signed)
Called patient. She answered. Dicussed reason for dismissal.  She stated understanding.  She apologized.  She said that she did not intentionally miss her appointments, she just has a lot going on and some many appts to attend.  She said she does have a new physician that she plans to see beginning in March.  She couldn't not remember provider's name, but stated that she plans to go to [facility].     PT:19.8 INR: 1.71 Collected: 12/28/16 Received: 12/29/16  As far as her PT/INR.  Results reviewed (see above).  Pt stated she is currently taking Coumadin 1 tablet by mouth every day except 1.5 tablets by mouth on Sunday.  This dosing is different than what provider advised during last OV: Warfarin: Take 5 mg daily except for Sunday and Thursday. You will take 7.5 mg (1.5 tabs) on Sunday and Thursday.  Please advise.

## 2017-01-15 NOTE — Telephone Encounter (Signed)
Previous note discussed with Dr. Carmelia RollerWendling.  Verbal order given for patient to schedule a nurse visit either tomorrow afternoon or Wednesday.    Called patient back.  Unable to reach patient.  Unable to leave voice message. Mailbox full.

## 2017-01-16 NOTE — Telephone Encounter (Signed)
Appt scheduled

## 2017-01-16 NOTE — Telephone Encounter (Signed)
Patient scheduled with nurse for 01/17/17 at 2:30pm 30 minute slot.

## 2017-01-17 ENCOUNTER — Ambulatory Visit (INDEPENDENT_AMBULATORY_CARE_PROVIDER_SITE_OTHER): Payer: Medicare Other | Admitting: Family Medicine

## 2017-01-17 ENCOUNTER — Other Ambulatory Visit: Payer: Self-pay

## 2017-01-17 DIAGNOSIS — Z7901 Long term (current) use of anticoagulants: Secondary | ICD-10-CM

## 2017-01-17 DIAGNOSIS — Z86711 Personal history of pulmonary embolism: Secondary | ICD-10-CM

## 2017-01-17 DIAGNOSIS — G894 Chronic pain syndrome: Secondary | ICD-10-CM

## 2017-01-17 LAB — POCT INR: INR: 3.2

## 2017-01-17 MED ORDER — TIZANIDINE HCL 4 MG PO TABS: 4.0000 mg | ORAL_TABLET | Freq: Four times a day (QID) | ORAL | 0 refills | Status: AC | PRN

## 2017-01-17 MED ORDER — WARFARIN SODIUM 5 MG PO TABS: 5.0000 mg | ORAL_TABLET | Freq: Every day | ORAL | 0 refills | Status: AC

## 2017-01-17 NOTE — Patient Instructions (Signed)
Per Dr. Marya AmslerWindling patient to take Warfarin 5 mg Monday through Saturday and on Sunday take 7.5mg .Return for INR in 1 week.  Appointment scheduled for 01/24/17 at 2:30 pm.

## 2017-01-17 NOTE — Progress Notes (Signed)
Noted and agree. She has been rather difficult to maintain in therapeutic range for a myriad of issues. In part is due to noncompliance and in part that her nephrologist monitors her INR and we don't get the results immediately while that provider does not wish to adjust her regimen. Will decrease slightly and recheck in 1 week.

## 2017-01-17 NOTE — Progress Notes (Signed)
Pre visit review using our clinic tool,if applicable. No additional management support is needed unless otherwise documented below in the visit note.   Patient in for INR Check. INR=3.2 today.  Per Dr. Marya AmslerWindling patient to take Warfarin 5 mg Monday through Saturday and on Sunday take 7.5mg .Return for INR in 1 week.  Appointment scheduled for 01/24/17 at 2:30 pm.

## 2017-01-17 NOTE — Progress Notes (Signed)
9

## 2017-01-24 ENCOUNTER — Ambulatory Visit: Payer: Self-pay

## 2017-01-25 LAB — PROTIME-INR: INR: 3.3 — AB (ref 0.9–1.1)

## 2017-01-31 ENCOUNTER — Ambulatory Visit: Payer: Medicare Other

## 2017-02-01 ENCOUNTER — Emergency Department (HOSPITAL_COMMUNITY)
Admission: EM | Admit: 2017-02-01 | Discharge: 2017-02-01 | Disposition: A | Discharge status: Home / Self Care | Payer: Medicare Other | Attending: Emergency Medicine | Admitting: Emergency Medicine

## 2017-02-01 ENCOUNTER — Encounter (HOSPITAL_COMMUNITY): Payer: Self-pay

## 2017-02-01 DIAGNOSIS — I120 Hypertensive chronic kidney disease with stage 5 chronic kidney disease or end stage renal disease: Secondary | ICD-10-CM | POA: Insufficient documentation

## 2017-02-01 DIAGNOSIS — Z79899 Other long term (current) drug therapy: Secondary | ICD-10-CM | POA: Insufficient documentation

## 2017-02-01 DIAGNOSIS — E039 Hypothyroidism, unspecified: Secondary | ICD-10-CM | POA: Insufficient documentation

## 2017-02-01 DIAGNOSIS — E1140 Type 2 diabetes mellitus with diabetic neuropathy, unspecified: Secondary | ICD-10-CM | POA: Insufficient documentation

## 2017-02-01 DIAGNOSIS — N186 End stage renal disease: Secondary | ICD-10-CM | POA: Diagnosis not present

## 2017-02-01 DIAGNOSIS — Z9115 Patient's noncompliance with renal dialysis: Secondary | ICD-10-CM | POA: Diagnosis present

## 2017-02-01 DIAGNOSIS — E1122 Type 2 diabetes mellitus with diabetic chronic kidney disease: Secondary | ICD-10-CM | POA: Insufficient documentation

## 2017-02-01 DIAGNOSIS — Z992 Dependence on renal dialysis: Secondary | ICD-10-CM

## 2017-02-01 DIAGNOSIS — Z7901 Long term (current) use of anticoagulants: Secondary | ICD-10-CM | POA: Insufficient documentation

## 2017-02-01 LAB — BASIC METABOLIC PANEL
ANION GAP: 16 — AB (ref 5–15)
BUN: 57 mg/dL — ABNORMAL HIGH (ref 6–20)
CALCIUM: 8.4 mg/dL — AB (ref 8.9–10.3)
CO2: 25 mmol/L (ref 22–32)
Chloride: 93 mmol/L — ABNORMAL LOW (ref 101–111)
Creatinine, Ser: 14.07 mg/dL — ABNORMAL HIGH (ref 0.44–1.00)
GFR, EST AFRICAN AMERICAN: 3 mL/min — AB (ref >60)
GFR, EST NON AFRICAN AMERICAN: 3 mL/min — AB (ref >60)
Glucose, Bld: 130 mg/dL — ABNORMAL HIGH (ref 65–99)
POTASSIUM: 4.5 mmol/L (ref 3.5–5.1)
Sodium: 134 mmol/L — ABNORMAL LOW (ref 135–145)

## 2017-02-01 LAB — CBC
HCT: 46.8 % — ABNORMAL HIGH (ref 36.0–46.0)
HEMOGLOBIN: 15 g/dL (ref 12.0–15.0)
MCH: 31.7 pg (ref 26.0–34.0)
MCHC: 32.1 g/dL (ref 30.0–36.0)
MCV: 98.9 fL (ref 78.0–100.0)
Platelets: 213 10*3/uL (ref 150–400)
RBC: 4.73 MIL/uL (ref 3.87–5.11)
RDW: 16.2 % — ABNORMAL HIGH (ref 11.5–15.5)
WBC: 8.7 10*3/uL (ref 4.0–10.5)

## 2017-02-01 NOTE — ED Provider Notes (Signed)
[facility]-EM[facility] DEPT Provider Note   CSN: [id number] Arrival date & time: 02/01/17  1802     History   Chief Complaint Chief Complaint  Patient presents with  . Vascular Access Problem    HPI  Blood pressure 101/87, pulse 83, temperature 97.7 F (36.5 C), temperature source Oral, resp. rate 18, SpO2 99 %.  Christina Walters is a 47 y.o. female by primary care for possible dialysis. Patient states that she missed her dialysis appointment on Wednesday because she took a new sleeping pill and slept through her appointment, they were not able to fit her into dialysis today. States she has no complaints she denies shortness of breath, chest pain, nausea, vomiting, dizziness or increasing peripheral edema or cough.  Past Medical History:  Diagnosis Date  . Anemia   . Anxiety   . Arthritis    "left hand" (05/16/2016)  . Chronic lower back pain   . Constipation   . Depression   . Dysrhythmia    irregular heart rate - A- fib   . ESRD (end stage renal disease) on dialysis [facility])    "MWF; [place]" (05/16/2016)  . History of blood transfusion 2012   "related to leg OR"  . Hypothyroidism   . Morbid obesity (HCC) 08/24/2016  . Neuropathy (HCC)   . On home oxygen therapy    "2-3L when I sleep at night" (05/16/2016)  . OSA (obstructive sleep apnea)    "waiting to get my test done cause I need a new machine" (05/16/2016)using O2  . Pulmonary embolism (HCC) 05/15/2016   "small one"  . Pulmonary embolism (HCC) 2017   on Coumadin  . Restless legs syndrome   . Type II diabetes mellitus (HCC) dx'd 1981   controlled, not on medications    Patient Active Problem List   Diagnosis Date Noted  . History of pulmonary embolism 08/24/2016  . Morbid obesity (HCC) 08/24/2016  . OSA (obstructive sleep apnea) 08/24/2016  . ESRD (end stage renal disease) (HCC)   . Thrombus of venous dialysis catheter (HCC) 05/15/2016  . ESRD (end stage renal disease) on dialysis (HCC) 05/15/2016  .  Hypotension 05/15/2016  . Hyperkalemia 05/09/2016    Past Surgical History:  Procedure Laterality Date  . ARTERIOVENOUS GRAFT PLACEMENT Bilateral   . AV FISTULA PLACEMENT Right 07/25/2016   Procedure: INSERTION OF ARTERIOVENOUS (AV) GORE-TEX GRAFT RIGHT ARM;  Surgeon: Maeola Harman, MD;  Location: [facility] OR;  Service: Vascular;  Laterality: Right;  . BELOW KNEE LEG AMPUTATION Left 2012  . DEBRIDMENT OF DECUBITUS ULCER Bilateral ~ 2011   "hips"  . DILATION AND CURETTAGE OF UTERUS    . ESOPHAGOGASTRODUODENOSCOPY    . INCISION AND DRAINAGE OF WOUND Right ~ 2011   "bubble mass in my thigh"  . INSERTION OF DIALYSIS CATHETER Left 07/25/2016   Procedure: EXCHANGE RIGHT TO LEFT CHEST DIALYSIS CATHETER;  Surgeon: Maeola Harman, MD;  Location: [facility] OR;  Service: Vascular;  Laterality: Left;  . PERIPHERAL VASCULAR CATHETERIZATION N/A 07/03/2016   Procedure: Upper Extremity Venography;  Surgeon: Chuck Hint, MD;  Location: [facility] INVASIVE CV LAB;  Service: Cardiovascular;  Laterality: N/A;  . THROMBECTOMY / ARTERIOVENOUS GRAFT REVISION Bilateral     OB History    No data available       Home Medications    Prior to Admission medications   Medication Sig Start Date End Date Taking? Authorizing Provider  cinacalcet (SENSIPAR) 30 MG tablet Take 30 mg by mouth daily with  breakfast.    Yes Historical Provider, MD  diclofenac sodium (VOLTAREN) 1 % GEL Apply to affected area on back three times daily as needed. 11/16/16  Yes Jilda Roche Wendling, DO  Docusate Sodium 100 MG capsule Take 100 mg by mouth 2 (two) times daily as needed for constipation.  10/30/12  Yes Historical Provider, MD  DULoxetine (CYMBALTA) 30 MG capsule Take 1 capsule (30 mg total) by mouth daily. Take with a 60 mg capsule for a 90 mg dose 12/18/16  Yes Jilda Roche Wendling, DO  DULoxetine (CYMBALTA) 60 MG capsule Take 1 capsule (60 mg total) by mouth daily. Take with a 30 mg capsule for a 90 mg dose  12/18/16  Yes Jilda Roche Wendling, DO  gabapentin (NEURONTIN) 300 MG capsule 1 cap in AM, 1 cap in afternoon, 2 caps in evening. Patient taking differently: Take 300 mg by mouth See admin instructions. 1 cap in AM, 1 cap in afternoon, 2 caps in evening. 12/18/16  Yes Jilda Roche Wendling, DO  HYDROcodone-acetaminophen (NORCO) 10-325 MG tablet Take 1 tablet by mouth every 6 (six) hours as needed for moderate pain. 12/22/16  Yes Jilda Roche Wendling, DO  hydrOXYzine (ATARAX/VISTARIL) 50 MG tablet Take 1 tablet (50 mg total) by mouth 3 (three) times daily as needed for anxiety or itching. 12/18/16  Yes Sharlene Dory, DO  levothyroxine (SYNTHROID, LEVOTHROID) 88 MCG tablet Take 88 mcg by mouth daily before breakfast.  08/27/13  Yes Historical Provider, MD  midodrine (PROAMATINE) 10 MG tablet Take 1 tablet by mouth 3 (three) times daily. 08/03/16  Yes Historical Provider, MD  mirtazapine (REMERON) 7.5 MG tablet Take 7.5 mg by mouth at bedtime.  04/28/14  Yes Historical Provider, MD  OXYGEN Inhale 2 L into the lungs at bedtime.   Yes Historical Provider, MD  tiZANidine (ZANAFLEX) 4 MG tablet Take 1 tablet (4 mg total) by mouth every 6 (six) hours as needed for muscle spasms. 01/17/17  Yes Sharlene Dory, DO  warfarin (COUMADIN) 5 MG tablet Take 1 tablet (5 mg total) by mouth daily at 6 PM. Patient taking differently: Take 5-7.5 mg by mouth daily at 6 PM.  01/17/17  Yes Sharlene Dory, DO  zolpidem (AMBIEN) 5 MG tablet Take 5 mg by mouth at bedtime.   Yes Historical Provider, MD  multivitamin (RENA-VIT) TABS tablet Take 1 tablet by mouth at bedtime. Patient not taking: Reported on 02/01/2017 05/22/16   Dorothea Ogle, MD  traZODone (DESYREL) 50 MG tablet TAKE ONE TABLET BY MOUTH ONCE DAILY AT BEDTIME AS NEEDED FOR SLEEP Patient not taking: Reported on 02/01/2017 01/09/17   Sharlene Dory, DO    Family History Family History  Problem Relation Age of Onset  . Hypertension Mother     . Kidney Stones Mother   . Arthritis Mother   . Non-Hodgkin's lymphoma Father     Social History Social History  Substance Use Topics  . Smoking status: Never Smoker  . Smokeless tobacco: Never Used  . Alcohol use No     Allergies   Penicillins; Sulfa antibiotics; Sulfamethoxazole-trimethoprim; and Fentanyl   Review of Systems Review of Systems  10 systems reviewed and found to be negative, except as noted in the HPI.   Physical Exam Updated Vital Signs BP 101/87 (BP Location: Left Arm)   Pulse 83   Temp 97.7 F (36.5 C) (Oral)   Resp 18   SpO2 99%   Physical Exam  Constitutional: She is oriented to person,  place, and time. She appears well-developed and well-nourished. No distress.  Obese  HENT:  Head: Normocephalic.  Mouth/Throat: Oropharynx is clear and moist.  Eyes: Conjunctivae are normal.  Neck: Normal range of motion. No JVD present. No tracheal deviation present.  Cardiovascular: Normal rate, regular rhythm and intact distal pulses.   Radial pulse equal bilaterally  Fistula to right brachial area with thrill auscultated bowl by stethoscope  Pulmonary/Chest: Effort normal and breath sounds normal. No stridor. No respiratory distress. She has no wheezes. She has no rales. She exhibits no tenderness.  Abdominal: Soft. She exhibits no distension and no mass. There is no tenderness. There is no rebound and no guarding.  Musculoskeletal: Normal range of motion. She exhibits no edema or tenderness.  No calf asymmetry, superficial collaterals, palpable cords, edema, Homans sign negative bilaterally.    Neurological: She is alert and oriented to person, place, and time.  Skin: Skin is warm. She is not diaphoretic.  Psychiatric: She has a normal mood and affect.  Nursing note and vitals reviewed.    ED Treatments / Results  Labs (all labs ordered are listed, but only abnormal results are displayed) Labs Reviewed  BASIC METABOLIC PANEL - Abnormal; Notable  for the following:       Result Value   Sodium 134 (*)    Chloride 93 (*)    Glucose, Bld 130 (*)    BUN 57 (*)    Creatinine, Ser 14.07 (*)    Calcium 8.4 (*)    GFR calc non Af Amer 3 (*)    GFR calc Af Amer 3 (*)    Anion gap 16 (*)    All other components within normal limits  CBC - Abnormal; Notable for the following:    HCT 46.8 (*)    RDW 16.2 (*)    All other components within normal limits    EKG  EKG Interpretation None       Radiology No results found.  Procedures Procedures (including critical care time)  Medications Ordered in ED Medications - No data to display   Initial Impression / Assessment and Plan / ED Course  I have reviewed the triage vital signs and the nursing notes.  Pertinent labs & imaging results that were available during my care of the patient were reviewed by me and considered in my medical decision making (see chart for details).     Vitals:   02/01/17 1814  BP: 101/87  Pulse: 83  Resp: 18  Temp: 97.7 F (36.5 C)  TempSrc: Oral  SpO2: 99%    Medications - No data to display  Christina Walters is 46 y.o. female asking for possible dialysis. She missed her regular dialysis 2 days ago. Patient with no complaints, blood work reassuring, no shortness of breath or peripheral edema, no indication for emergent dialysis. Advised her to call dialysis Center to see if they could fit her in tomorrow.  Evaluation does not show pathology that would require ongoing emergent intervention or inpatient treatment. Pt is hemodynamically stable and mentating appropriately. Discussed findings and plan with patient/guardian, who agrees with care plan. All questions answered. Return precautions discussed and outpatient follow up given.      Final Clinical Impressions(s) / ED Diagnoses   Final diagnoses:  ESRD on dialysis [facility](HCC)    New Prescriptions Discharge Medication List as of 02/01/2017 10:46 PM       Wynetta EmeryNicole Kristyl Athens,  PA-C 02/02/17 [id number]    Margarita Grizzleanielle Ray, MD 02/02/17 2207

## 2017-02-01 NOTE — Discharge Instructions (Signed)
Return to ED if you have any new symptoms, [place] with your Dialysis center ASAP.

## 2017-02-01 NOTE — Care Management (Addendum)
ED CM [facility] Management Navigation team. She reports has missed several HD treatments last tx 01/27/17,  she [facility] HD [facility] [place] T/Th/Sat.  CM will continue to follow.

## 2017-02-01 NOTE — ED Triage Notes (Signed)
Pt reports she is here because she missed dialysis today. She reports she drank soy milk last night and had upset stomach. Pt denies any other symptoms. She missed Tuesday as well.

## 2017-02-02 ENCOUNTER — Telehealth: Payer: Self-pay | Admitting: Surgery

## 2017-02-02 NOTE — Telephone Encounter (Signed)
ED CM contacted [facility] Care Navigation team to inform of patient discharge. HD Care Navigation team will coordinate next HD tx. No further ED CM needs.

## 2017-02-04 ENCOUNTER — Encounter (HOSPITAL_COMMUNITY): Payer: Self-pay | Admitting: *Deleted

## 2017-02-04 ENCOUNTER — Emergency Department (HOSPITAL_COMMUNITY)
Admission: EM | Admit: 2017-02-04 | Discharge: 2017-02-04 | Disposition: A | Discharge status: Home / Self Care | Payer: Medicare Other | Attending: Emergency Medicine | Admitting: Emergency Medicine

## 2017-02-04 DIAGNOSIS — Y731 Therapeutic (nonsurgical) and rehabilitative gastroenterology and urology devices associated with adverse incidents: Secondary | ICD-10-CM | POA: Insufficient documentation

## 2017-02-04 DIAGNOSIS — Z992 Dependence on renal dialysis: Secondary | ICD-10-CM | POA: Insufficient documentation

## 2017-02-04 DIAGNOSIS — N186 End stage renal disease: Secondary | ICD-10-CM | POA: Diagnosis not present

## 2017-02-04 DIAGNOSIS — T82838A Hemorrhage of vascular prosthetic devices, implants and grafts, initial encounter: Secondary | ICD-10-CM | POA: Diagnosis not present

## 2017-02-04 DIAGNOSIS — Z79899 Other long term (current) drug therapy: Secondary | ICD-10-CM | POA: Insufficient documentation

## 2017-02-04 DIAGNOSIS — E1140 Type 2 diabetes mellitus with diabetic neuropathy, unspecified: Secondary | ICD-10-CM | POA: Diagnosis not present

## 2017-02-04 DIAGNOSIS — Z7901 Long term (current) use of anticoagulants: Secondary | ICD-10-CM | POA: Insufficient documentation

## 2017-02-04 DIAGNOSIS — E1122 Type 2 diabetes mellitus with diabetic chronic kidney disease: Secondary | ICD-10-CM | POA: Insufficient documentation

## 2017-02-04 DIAGNOSIS — E039 Hypothyroidism, unspecified: Secondary | ICD-10-CM | POA: Insufficient documentation

## 2017-02-04 LAB — BASIC METABOLIC PANEL
Anion gap: 15 (ref 5–15)
BUN: 35 mg/dL — ABNORMAL HIGH (ref 6–20)
CHLORIDE: 93 mmol/L — AB (ref 101–111)
CO2: 27 mmol/L (ref 22–32)
CREATININE: 11.32 mg/dL — AB (ref 0.44–1.00)
Calcium: 8.1 mg/dL — ABNORMAL LOW (ref 8.9–10.3)
GFR calc non Af Amer: 4 mL/min — ABNORMAL LOW (ref >60)
GFR, EST AFRICAN AMERICAN: 4 mL/min — AB (ref >60)
Glucose, Bld: 102 mg/dL — ABNORMAL HIGH (ref 65–99)
POTASSIUM: 4.4 mmol/L (ref 3.5–5.1)
Sodium: 135 mmol/L (ref 135–145)

## 2017-02-04 LAB — PROTIME-INR
INR: 6.86
Prothrombin Time: 61.6 seconds — ABNORMAL HIGH (ref 11.4–15.2)

## 2017-02-04 LAB — CBC WITH DIFFERENTIAL/PLATELET
Basophils Absolute: 0 10*3/uL (ref 0.0–0.1)
Basophils Relative: 0 %
Eosinophils Absolute: 0.4 10*3/uL (ref 0.0–0.7)
Eosinophils Relative: 5 %
HEMATOCRIT: 48.2 % — AB (ref 36.0–46.0)
HEMOGLOBIN: 15.4 g/dL — AB (ref 12.0–15.0)
LYMPHS ABS: 2.2 10*3/uL (ref 0.7–4.0)
LYMPHS PCT: 31 %
MCH: 31.9 pg (ref 26.0–34.0)
MCHC: 32 g/dL (ref 30.0–36.0)
MCV: 99.8 fL (ref 78.0–100.0)
MONOS PCT: 9 %
Monocytes Absolute: 0.7 10*3/uL (ref 0.1–1.0)
NEUTROS ABS: 3.8 10*3/uL (ref 1.7–7.7)
Neutrophils Relative %: 55 %
Platelets: 198 10*3/uL (ref 150–400)
RBC: 4.83 MIL/uL (ref 3.87–5.11)
RDW: 16.3 % — ABNORMAL HIGH (ref 11.5–15.5)
WBC: 7 10*3/uL (ref 4.0–10.5)

## 2017-02-04 NOTE — ED Triage Notes (Addendum)
[facility] to ED [facility] for bleeding fistula. Pt had last treatment on Saturday. At 1am, had bleeding from dialysis site, pt placed a bandage, bleeding appeared to slow. Pt felt a gush of bleeding at 0500, EMS called, applied pinpoint pressure which stopped bleeding. Combat guaze and coban applied on arrival, bleeding controlled at present. Pt is on warfarin

## 2017-02-04 NOTE — Discharge Instructions (Signed)
Please hold your coumadin today and tomorrow and restart Tuesday as we discussed. Please follow up with your doctor for recheck of your INR.

## 2017-02-04 NOTE — ED Provider Notes (Signed)
[facility] Provider Note   CSN: [id number] Arrival date & time: 02/04/17  0608     History   Chief Complaint Chief Complaint  Patient presents with  . Vascular Access Problem    HPI Christina Walters is a 46 y.o. female.  Christina Walters is a 46 y.o. Female with ESRD, and hx of PE on coumadin who presents to the ED via EMS for a bleeding fistula. Patient reports she last had dialysis yesterday. She reports she had some slight bleeding after removing her dressing yesterday evening. She reports waking up around 5 AM and felt a gush of bleeding and called EMS. EMS applied direct pressure and on arrival to the emergency department combat gauze was applied. Patient tells me she is otherwise feeling well. She's been compliant with her medications. She reports feeling slightly lightheaded earlier today, but this has resolved. She denies fevers, chest pain, shortness of breath, abdominal pain, numbness, tingling or weakness.   The history is provided by the patient and medical records. No language interpreter was used.    Past Medical History:  Diagnosis Date  . Anemia   . Anxiety   . Arthritis    "left hand" (05/16/2016)  . Chronic lower back pain   . Constipation   . Depression   . Dysrhythmia    irregular heart rate - A- fib   . ESRD (end stage renal disease) on dialysis [facility])    "MWF; [place]" (05/16/2016)  . History of blood transfusion 2012   "related to leg OR"  . Hypothyroidism   . Morbid obesity (HCC) 08/24/2016  . Neuropathy (HCC)   . On home oxygen therapy    "2-3L when I sleep at night" (05/16/2016)  . OSA (obstructive sleep apnea)    "waiting to get my test done cause I need a new machine" (05/16/2016)using O2  . Pulmonary embolism (HCC) 05/15/2016   "small one"  . Pulmonary embolism (HCC) 2017   on Coumadin  . Restless legs syndrome   . Type II diabetes mellitus (HCC) dx'd 1981   controlled, not on medications    Patient Active Problem  List   Diagnosis Date Noted  . History of pulmonary embolism 08/24/2016  . Morbid obesity (HCC) 08/24/2016  . OSA (obstructive sleep apnea) 08/24/2016  . ESRD (end stage renal disease) (HCC)   . Thrombus of venous dialysis catheter (HCC) 05/15/2016  . ESRD (end stage renal disease) on dialysis (HCC) 05/15/2016  . Hypotension 05/15/2016  . Hyperkalemia 05/09/2016    Past Surgical History:  Procedure Laterality Date  . ARTERIOVENOUS GRAFT PLACEMENT Bilateral   . AV FISTULA PLACEMENT Right 07/25/2016   Procedure: INSERTION OF ARTERIOVENOUS (AV) GORE-TEX GRAFT RIGHT ARM;  Surgeon: Maeola Harman, MD;  Location: [facility] OR;  Service: Vascular;  Laterality: Right;  . BELOW KNEE LEG AMPUTATION Left 2012  . DEBRIDMENT OF DECUBITUS ULCER Bilateral ~ 2011   "hips"  . DILATION AND CURETTAGE OF UTERUS    . ESOPHAGOGASTRODUODENOSCOPY    . INCISION AND DRAINAGE OF WOUND Right ~ 2011   "bubble mass in my thigh"  . INSERTION OF DIALYSIS CATHETER Left 07/25/2016   Procedure: EXCHANGE RIGHT TO LEFT CHEST DIALYSIS CATHETER;  Surgeon: Maeola Harman, MD;  Location: [facility] OR;  Service: Vascular;  Laterality: Left;  . PERIPHERAL VASCULAR CATHETERIZATION N/A 07/03/2016   Procedure: Upper Extremity Venography;  Surgeon: Chuck Hint, MD;  Location: [facility] INVASIVE CV LAB;  Service: Cardiovascular;  Laterality: N/A;  .  THROMBECTOMY / ARTERIOVENOUS GRAFT REVISION Bilateral     OB History    No data available       Home Medications    Prior to Admission medications   Medication Sig Start Date End Date Taking? Authorizing Provider  cinacalcet (SENSIPAR) 30 MG tablet Take 30 mg by mouth daily with breakfast.     Historical Provider, MD  diclofenac sodium (VOLTAREN) 1 % GEL Apply to affected area on back three times daily as needed. 11/16/16   Sharlene DoryNicholas Paul Wendling, DO  Docusate Sodium 100 MG capsule Take 100 mg by mouth 2 (two) times daily as needed for constipation.  10/30/12    Historical Provider, MD  DULoxetine (CYMBALTA) 30 MG capsule Take 1 capsule (30 mg total) by mouth daily. Take with a 60 mg capsule for a 90 mg dose 12/18/16   Sharlene DoryNicholas Paul Wendling, DO  DULoxetine (CYMBALTA) 60 MG capsule Take 1 capsule (60 mg total) by mouth daily. Take with a 30 mg capsule for a 90 mg dose 12/18/16   Jilda RocheNicholas Paul Wendling, DO  gabapentin (NEURONTIN) 300 MG capsule 1 cap in AM, 1 cap in afternoon, 2 caps in evening. Patient taking differently: Take 300 mg by mouth See admin instructions. 1 cap in AM, 1 cap in afternoon, 2 caps in evening. 12/18/16   Sharlene DoryNicholas Paul Wendling, DO  HYDROcodone-acetaminophen [facility](NORCO) 10-325 MG tablet Take 1 tablet by mouth every 6 (six) hours as needed for moderate pain. 12/22/16   Sharlene DoryNicholas Paul Wendling, DO  hydrOXYzine (ATARAX/VISTARIL) 50 MG tablet Take 1 tablet (50 mg total) by mouth 3 (three) times daily as needed for anxiety or itching. 12/18/16   Sharlene DoryNicholas Paul Wendling, DO  levothyroxine (SYNTHROID, LEVOTHROID) 88 MCG tablet Take 88 mcg by mouth daily before breakfast.  08/27/13   Historical Provider, MD  midodrine (PROAMATINE) 10 MG tablet Take 1 tablet by mouth 3 (three) times daily. 08/03/16   Historical Provider, MD  mirtazapine (REMERON) 7.5 MG tablet Take 7.5 mg by mouth at bedtime.  04/28/14   Historical Provider, MD  multivitamin (RENA-VIT) TABS tablet Take 1 tablet by mouth at bedtime. Patient not taking: Reported on 02/01/2017 05/22/16   Dorothea OgleIskra M Myers, MD  OXYGEN Inhale 2 L into the lungs at bedtime.    Historical Provider, MD  tiZANidine (ZANAFLEX) 4 MG tablet Take 1 tablet (4 mg total) by mouth every 6 (six) hours as needed for muscle spasms. 01/17/17   Sharlene DoryNicholas Paul Wendling, DO  traZODone (DESYREL) 50 MG tablet TAKE ONE TABLET BY MOUTH ONCE DAILY AT BEDTIME AS NEEDED FOR SLEEP Patient not taking: Reported on 02/01/2017 01/09/17   Sharlene DoryNicholas Paul Wendling, DO  warfarin (COUMADIN) 5 MG tablet Take 1 tablet (5 mg total) by mouth daily at 6 PM. Patient  taking differently: Take 5-7.5 mg by mouth daily at 6 PM.  01/17/17   Sharlene DoryNicholas Paul Wendling, DO  zolpidem (AMBIEN) 5 MG tablet Take 5 mg by mouth at bedtime.    Historical Provider, MD    Family History Family History  Problem Relation Age of Onset  . Hypertension Mother   . Kidney Stones Mother   . Arthritis Mother   . Non-Hodgkin's lymphoma Father     Social History Social History  Substance Use Topics  . Smoking status: Never Smoker  . Smokeless tobacco: Never Used  . Alcohol use No     Allergies   Penicillins; Sulfa antibiotics; Sulfamethoxazole-trimethoprim; and Fentanyl   Review of Systems Review of Systems  Constitutional: Negative  for chills and fever.  HENT: Negative for congestion and sore throat.   Eyes: Negative for visual disturbance.  Respiratory: Negative for cough and shortness of breath.   Cardiovascular: Negative for chest pain and palpitations.  Gastrointestinal: Negative for abdominal pain, nausea and vomiting.  Genitourinary: Negative for flank pain.  Musculoskeletal: Negative for back pain and neck pain.  Skin: Negative for rash.       Bleeding fistula   Neurological: Positive for light-headedness (resolved ). Negative for headaches.     Physical Exam Updated Vital Signs BP (!) 92/51 (BP Location: Left Arm)   Pulse 71   Temp 97.8 F (36.6 C) (Oral)   Resp 10   SpO2 99%   Physical Exam  Constitutional: She is oriented to person, place, and time. She appears well-developed and well-nourished. No distress.  Nontoxic-appearing. Obese female.  HENT:  Head: Normocephalic and atraumatic.  Eyes: Conjunctivae are normal. Pupils are equal, round, and reactive to light. Right eye exhibits no discharge. Left eye exhibits no discharge.  Neck: Neck supple.  Cardiovascular: Normal rate, regular rhythm, normal heart sounds and intact distal pulses.   Pulmonary/Chest: Effort normal and breath sounds normal. No respiratory distress.  Abdominal: Soft.  There is no tenderness.  Musculoskeletal:  Right upper arm fistula wrapped in combat gauze. No bleeding noted on my exam.   Lymphadenopathy:    She has no cervical adenopathy.  Neurological: She is alert and oriented to person, place, and time. Coordination normal.  Skin: Skin is warm and dry. Capillary refill takes less than 2 seconds. No rash noted. She is not diaphoretic. No erythema. No pallor.  Psychiatric: She has a normal mood and affect. Her behavior is normal.  Nursing note and vitals reviewed.    ED Treatments / Results  Labs (all labs ordered are listed, but only abnormal results are displayed) Labs Reviewed  BASIC METABOLIC PANEL - Abnormal; Notable for the following:       Result Value   Chloride 93 (*)    Glucose, Bld 102 (*)    BUN 35 (*)    Creatinine, Ser 11.32 (*)    Calcium 8.1 (*)    GFR calc non Af Amer 4 (*)    GFR calc Af Amer 4 (*)    All other components within normal limits  CBC WITH DIFFERENTIAL/PLATELET - Abnormal; Notable for the following:    Hemoglobin 15.4 (*)    HCT 48.2 (*)    RDW 16.3 (*)    All other components within normal limits  PROTIME-INR - Abnormal; Notable for the following:    Prothrombin Time 61.6 (*)    INR 6.86 (*)    All other components within normal limits    EKG  EKG Interpretation None       Radiology No results found.  Procedures Procedures (including critical care time)  Medications Ordered in ED Medications - No data to display   Initial Impression / Assessment and Plan / ED Course  I have reviewed the triage vital signs and the nursing notes.  Pertinent labs & imaging results that were available during my care of the patient were reviewed by me and considered in my medical decision making (see chart for details).    This is a 46 y.o. Female with ESRD, and hx of PE on coumadin who presents to the ED via EMS for a bleeding fistula. Patient reports she last had dialysis yesterday. She reports she had  some slight bleeding after removing her  dressing yesterday evening. She reports waking up around 5 AM and felt a gush of bleeding and called EMS. EMS applied direct pressure and on arrival to the emergency department combat gauze was applied. Patient tells me she is otherwise feeling well. She's been compliant with her medications.  On exam the patient is afebrile nontoxic appearing. She had combat gauze and Coban and placed by Dr. Wilkie Aye on arrival. This successfully stopped the bleeding. At several re-evaluations patient had no further bleeding. INR is 6.86. We'll have the patient hold her Coumadin today and tomorrow and restart back Tuesday. BMP is consistent with the patient in end-stage renal disease. Normal potassium and sodium. Hemoglobin is 15.4. Final reevaluation patient has had no further bleeding. Pressures are 90 over 50s. This is baseline for the patient. She reports feeling well. Will discharge at this time. I again discussed holding her Coumadin for the next 2 days. I advised to maintain the dressing until this afternoon. I discussed strict and specific return precautions. I advised the patient to follow-up with their primary care provider this week. I advised the patient to return to the emergency department with new or worsening symptoms or new concerns. The patient verbalized understanding and agreement with plan.   This patient was discussed with and evaluated by Dr. Wilkie Aye who agrees with assessment and plan.   Final Clinical Impressions(s) / ED Diagnoses   Final diagnoses:  Bleeding from dialysis shunt, initial encounter [facility])    New Prescriptions New Prescriptions   No medications on file     Everlene Farrier, PA-C 02/04/17 1610    Shon Baton, MD 02/05/17 [phone]

## 2017-02-04 NOTE — ED Notes (Signed)
Dr Penne LashIssacs made aware of abnormal pt/inr results

## 2017-02-05 ENCOUNTER — Telehealth: Payer: Self-pay | Admitting: Family Medicine

## 2017-02-05 NOTE — Telephone Encounter (Signed)
Certified dismissal letter returned as undeliverable, unclaimed, return to sender after three attempts by [employer] on February 05, 2017.  Letter placed in another envelope and resent as 1st class mail which does not require a signature. °DAJ °

## 2017-03-01 ENCOUNTER — Telehealth: Payer: Self-pay | Admitting: *Deleted

## 2017-03-01 NOTE — Telephone Encounter (Signed)
Patient called in to Triage line c/o increased pain in her arm ( Right arm AVG inserted 07-25-16 by Dr. Randie Heinzain).  I called her back but [facility]. I did call [facility] and spoke to StaplesAdriana regarding any problems at her HD sessions. Per Ricki RodriguezAdriana, the patient has missed several HD appts even though the [facility] is [facility] for Toad HopKelisha. She did come in today and had no problems with the actual HD process, Her AVG is functioning well and patient is afebrile, no erythema noted. Nurse did say that she was c/o generalized pain in all of her right arm that was not associated with the HD session today. Ricki Rodriguezdriana said that she would speak to her about this pain at her next session.    Patient has [facility](Dr. Carmelia RollerWendling) for noncompliance and multiple no show appts. I looked her up in the Database and see that she is currently on Pain contract with Dr. Kathrynn SpeedArvind at [facility] [place]. Her most recent Rxs: 02-02-17 Ambien 5 mg #30 and Hydrocodone-Acetaminophen 5-325mg  #60  1 tab BID. She received the same medications and quantities on 01-01-17 by Dr. Kathrynn SpeedArvind.   We will wait to see if our services are needed after [facility] at [facility].

## 2017-03-05 ENCOUNTER — Telehealth: Payer: Self-pay

## 2017-03-05 DIAGNOSIS — R200 Anesthesia of skin: Secondary | ICD-10-CM

## 2017-03-05 DIAGNOSIS — M79644 Pain in right finger(s): Secondary | ICD-10-CM

## 2017-03-05 DIAGNOSIS — R202 Paresthesia of skin: Secondary | ICD-10-CM

## 2017-03-05 DIAGNOSIS — R231 Pallor: Secondary | ICD-10-CM

## 2017-03-05 DIAGNOSIS — M79641 Pain in right hand: Secondary | ICD-10-CM

## 2017-03-05 NOTE — Telephone Encounter (Signed)
Phone call from pt.  Reported she is having a lot of pain in her right arm/ hand.  Stated that Dr. Randie Heinz told her he may have to do some further work on the graft.  Reported the pain is in her "right little finger and side of hand."  Reported she has been taking a muxcle relaxant, but "it is short-acting."  Stated the pain comes and goes, but is worse during dialysis.  Reported has intermittent numbness / tingling, and coolness in her right hand/ arm at times. Also admits to difficulty gripping with the right hand at times.  Advised will check with Dr Randie Heinz to determine if any vascular labs are needed with the appt., and will call with appt. Info.  Pt. agreed.

## 2017-03-06 NOTE — Telephone Encounter (Signed)
Scheduled 4/11@11am

## 2017-03-06 NOTE — Telephone Encounter (Signed)
RE: question of vascular lab  Received: Yesterday  Message Contents  Maeola Harman, MD  Smitty Pluck Donetta Isaza, RN        A steal study and f/u sounds prudent. Thanks   bc

## 2017-03-08 ENCOUNTER — Encounter: Payer: Self-pay | Admitting: Vascular Surgery

## 2017-03-14 ENCOUNTER — Ambulatory Visit (HOSPITAL_COMMUNITY): Payer: Medicare Other

## 2017-03-16 ENCOUNTER — Ambulatory Visit: Payer: Medicare Other | Admitting: Vascular Surgery

## 2017-03-23 ENCOUNTER — Encounter: Payer: Self-pay | Admitting: Vascular Surgery

## 2017-03-23 ENCOUNTER — Other Ambulatory Visit: Payer: Self-pay | Admitting: Family Medicine

## 2017-03-23 DIAGNOSIS — Z86711 Personal history of pulmonary embolism: Secondary | ICD-10-CM

## 2017-03-23 NOTE — Telephone Encounter (Signed)
Rx has been denied.  Pt is no longer under provider's care.//AB/CMA

## 2017-03-30 ENCOUNTER — Ambulatory Visit (INDEPENDENT_AMBULATORY_CARE_PROVIDER_SITE_OTHER): Payer: Medicare Other | Admitting: Vascular Surgery

## 2017-03-30 ENCOUNTER — Ambulatory Visit (HOSPITAL_COMMUNITY)
Admission: RE | Admit: 2017-03-30 | Discharge: 2017-03-30 | Disposition: A | Discharge status: Home / Self Care | Payer: Medicare Other | Source: Ambulatory Visit | Attending: Vascular Surgery | Admitting: Vascular Surgery

## 2017-03-30 ENCOUNTER — Encounter: Payer: Self-pay | Admitting: Vascular Surgery

## 2017-03-30 VITALS — BP 110/64 | HR 88 | Temp 98.0°F | Resp 20 | Ht 72.0 in | Wt 316.0 lb

## 2017-03-30 DIAGNOSIS — R200 Anesthesia of skin: Secondary | ICD-10-CM | POA: Diagnosis not present

## 2017-03-30 DIAGNOSIS — R202 Paresthesia of skin: Secondary | ICD-10-CM | POA: Diagnosis not present

## 2017-03-30 DIAGNOSIS — M79641 Pain in right hand: Secondary | ICD-10-CM | POA: Diagnosis not present

## 2017-03-30 DIAGNOSIS — N186 End stage renal disease: Secondary | ICD-10-CM

## 2017-03-30 DIAGNOSIS — R936 Abnormal findings on diagnostic imaging of limbs: Secondary | ICD-10-CM | POA: Insufficient documentation

## 2017-03-30 DIAGNOSIS — R231 Pallor: Secondary | ICD-10-CM | POA: Insufficient documentation

## 2017-03-30 DIAGNOSIS — M79644 Pain in right finger(s): Secondary | ICD-10-CM | POA: Diagnosis not present

## 2017-03-30 NOTE — Progress Notes (Signed)
Patient ID: Christina Walters, female   DOB: 29-Sep-1971, 46 y.o.   MRN: [id number]  Reason for Consult: Re-evaluation (R UE steal study. C/o pain in R 5th finger and lateral hand. )   Referred by Sharlene Dory*  Subjective:     HPI:  Christina Walters is a 46 y.o. female patient with end-stage renal disease having had multiple upper extremity accesses as well as a left thigh graft that was removed for infection. She also has a left below-knee amputation and does walk with prosthesis. We placed a right upper extremity loop graft several months ago as well as a tunneled dialysis catheter which is now been removed. She continues to dialyze via the right upper extremity AV graft but has been having pain for the past several months in the right hand and arm especially on dialysis. She is able to alleviate some of the pain with tizanidine but most of it is constant and nonradiating and cannot be alleviated fully. Exacerbating factors include dialysis. She is not having other issues related to today's visit. He is on Coumadin for a recent history of pulmonary embolus.  Past Medical History:  Diagnosis Date  . Anemia   . Anxiety   . Arthritis    "left hand" (05/16/2016)  . Chronic lower back pain   . Constipation   . Depression   . Dysrhythmia    irregular heart rate - A- fib   . ESRD (end stage renal disease) on dialysis [facility])    "MWF; [facility]" (05/16/2016)  . History of blood transfusion 2012   "related to leg OR"  . Hypothyroidism   . Morbid obesity (HCC) 08/24/2016  . Neuropathy   . On home oxygen therapy    "2-3L when I sleep at night" (05/16/2016)  . OSA (obstructive sleep apnea)    "waiting to get my test done cause I need a new machine" (05/16/2016)using O2  . Pulmonary embolism (HCC) 05/15/2016   "small one"  . Pulmonary embolism (HCC) 2017   on Coumadin  . Restless legs syndrome   . Type II diabetes mellitus (HCC) dx'd 1981   controlled, not on  medications   Family History  Problem Relation Age of Onset  . Hypertension Mother   . Kidney Stones Mother   . Arthritis Mother   . Non-Hodgkin's lymphoma Father    Past Surgical History:  Procedure Laterality Date  . ARTERIOVENOUS GRAFT PLACEMENT Bilateral   . AV FISTULA PLACEMENT Right 07/25/2016   Procedure: INSERTION OF ARTERIOVENOUS (AV) GORE-TEX GRAFT RIGHT ARM;  Surgeon: Maeola Harman, MD;  Location: [facility] OR;  Service: Vascular;  Laterality: Right;  . BELOW KNEE LEG AMPUTATION Left 2012  . DEBRIDMENT OF DECUBITUS ULCER Bilateral ~ 2011   "hips"  . DILATION AND CURETTAGE OF UTERUS    . ESOPHAGOGASTRODUODENOSCOPY    . INCISION AND DRAINAGE OF WOUND Right ~ 2011   "bubble mass in my thigh"  . INSERTION OF DIALYSIS CATHETER Left 07/25/2016   Procedure: EXCHANGE RIGHT TO LEFT CHEST DIALYSIS CATHETER;  Surgeon: Maeola Harman, MD;  Location: [facility] OR;  Service: Vascular;  Laterality: Left;  . PERIPHERAL VASCULAR CATHETERIZATION N/A 07/03/2016   Procedure: Upper Extremity Venography;  Surgeon: Chuck Hint, MD;  Location[facility] INVASIVE CV LAB;  Service: Cardiovascular;  Laterality: N/A;  . THROMBECTOMY / ARTERIOVENOUS GRAFT REVISION Bilateral     Short Social History:  Social History  Substance Use Topics  . Smoking status: Never Smoker  .  Smokeless tobacco: Never Used  . Alcohol use No    Allergies  Allergen Reactions  . Penicillins Hives and Itching    Has patient had a PCN reaction causing immediate rash, facial/tongue/throat swelling, SOB or lightheadedness with hypotension: Yes Has patient had a PCN reaction causing severe rash involving mucus membranes or skin necrosis: No Has patient had a PCN reaction that required hospitalization No Has patient had a PCN reaction occurring within the last 10 years: No If all of the above answers are "NO", then may proceed with Cephalosporin use.   . Sulfa Antibiotics Hives and Itching  .  Sulfamethoxazole-Trimethoprim Hives and Itching  . Fentanyl     Lethargic    Current Outpatient Prescriptions  Medication Sig Dispense Refill  . cinacalcet (SENSIPAR) 30 MG tablet Take 30 mg by mouth daily with breakfast.     . diclofenac sodium (VOLTAREN) 1 % GEL Apply to affected area on back three times daily as needed. 1 Tube 3  . Docusate Sodium 100 MG capsule Take 100 mg by mouth 2 (two) times daily as needed for constipation.     . DULoxetine (CYMBALTA) 30 MG capsule Take 1 capsule (30 mg total) by mouth daily. Take with a 60 mg capsule for a 90 mg dose 30 capsule 2  . DULoxetine (CYMBALTA) 60 MG capsule Take 1 capsule (60 mg total) by mouth daily. Take with a 30 mg capsule for a 90 mg dose 30 capsule 2  . gabapentin (NEURONTIN) 300 MG capsule 1 cap in AM, 1 cap in afternoon, 2 caps in evening. (Patient taking differently: Take 300 mg by mouth See admin instructions. 1 cap in AM, 1 cap in afternoon, 2 caps in evening.) 120 capsule 2  . HYDROcodone-acetaminophen (NORCO) 10-325 MG tablet Take 1 tablet by mouth every 6 (six) hours as needed for moderate pain. 20 tablet 0  . hydrOXYzine (ATARAX/VISTARIL) 50 MG tablet Take 1 tablet (50 mg total) by mouth 3 (three) times daily as needed for anxiety or itching. 30 tablet 2  . levothyroxine (SYNTHROID, LEVOTHROID) 88 MCG tablet Take 88 mcg by mouth daily before breakfast.     . midodrine (PROAMATINE) 10 MG tablet Take 1 tablet by mouth 3 (three) times daily.    . mirtazapine (REMERON) 7.5 MG tablet Take 7.5 mg by mouth at bedtime.     . multivitamin (RENA-VIT) TABS tablet Take 1 tablet by mouth at bedtime. 30 tablet 0  . OXYGEN Inhale 2 L into the lungs at bedtime.    Marland Kitchen tiZANidine (ZANAFLEX) 4 MG tablet Take 1 tablet (4 mg total) by mouth every 6 (six) hours as needed for muscle spasms. 30 tablet 0  . traZODone (DESYREL) 50 MG tablet TAKE ONE TABLET BY MOUTH ONCE DAILY AT BEDTIME AS NEEDED FOR SLEEP 60 tablet 0  . warfarin (COUMADIN) 5 MG tablet  Take 1 tablet (5 mg total) by mouth daily at 6 PM. (Patient taking differently: Take 5-7.5 mg by mouth daily at 6 PM. ) 30 tablet 0  . zolpidem (AMBIEN) 5 MG tablet Take 5 mg by mouth at bedtime.     No current facility-administered medications for this visit.     Review of Systems  Constitutional:  Constitutional negative. Musculoskeletal:       Hand pain Hematologic: Hematologic/lymphatic negative.        Objective:  Objective   Vitals:   03/30/17 0914  BP: 110/64  Pulse: 88  Resp: 20  Temp: 98 F (36.7 C)  TempSrc: Oral  SpO2: 92%  Weight: (!) 316 lb (143.3 kg)  Height: 6' (1.829 m)   Body mass index is 42.86 kg/m.  Physical Exam  Constitutional: She appears well-developed.  HENT:  Head: Normocephalic.  Cardiovascular: Normal rate.   Signal in right radial and ulnar arteries improves with compression of graft  Pulmonary/Chest: Effort normal.  Abdominal: Soft.  Musculoskeletal:  Right hand without ischemic changes  Skin: Skin is warm and dry.    Data: I have independently read and interpreted her right upper extremity steal study today that has a resting right radial artery pressure of 79 mmHg that improves to 146 with compression of her graft. This gives her marginal steal.     Assessment/Plan:    46yo female With multiple previous access procedures now with a right upper extremity AV graft which I was not sure whatever work but is now causing steal in her right hand. I discussed with her ligation versus putting up with the pain versus investigation. We will start with arch aortogram from a right femoral approach and look at her right upper extremity as well for possible intrinsic arterial disease. At that time we can also perform right lower extremity angiogram through the sheath to determine if she has arterial disease in the right leg and she would be a candidate for right thigh graft. Given previous infection in the left thigh and given her size I would not be  infused to placed a right thigh graft but it may be her last option if this upper extremity graft needs to be ligated. She demonstrates good understanding we will get her scheduled in the near future.     Maeola Harman MD [facility]

## 2017-04-09 ENCOUNTER — Encounter: Payer: Self-pay | Admitting: Vascular Surgery

## 2017-04-16 ENCOUNTER — Ambulatory Visit (HOSPITAL_COMMUNITY): Admission: RE | Admit: 2017-04-16 | Payer: Medicare Other | Source: Ambulatory Visit | Admitting: Vascular Surgery

## 2017-04-16 ENCOUNTER — Encounter (HOSPITAL_COMMUNITY): Admission: RE | Payer: Self-pay | Source: Ambulatory Visit

## 2017-04-16 SURGERY — AORTIC ARCH ANGIOGRAPHY
Anesthesia: LOCAL

## 2017-07-25 ENCOUNTER — Other Ambulatory Visit: Payer: Self-pay | Admitting: Family Medicine

## 2017-07-25 DIAGNOSIS — G894 Chronic pain syndrome: Secondary | ICD-10-CM

## 2017-08-09 ENCOUNTER — Other Ambulatory Visit: Payer: Self-pay | Admitting: *Deleted

## 2017-08-29 ENCOUNTER — Ambulatory Visit (HOSPITAL_COMMUNITY): Admission: RE | Admit: 2017-08-29 | Payer: Medicare Other | Source: Ambulatory Visit | Admitting: Vascular Surgery

## 2017-08-29 ENCOUNTER — Encounter (HOSPITAL_COMMUNITY): Admission: RE | Payer: Self-pay | Source: Ambulatory Visit

## 2017-08-29 SURGERY — A/V SHUNT INTERVENTION
Anesthesia: LOCAL

## 2017-09-07 ENCOUNTER — Encounter: Payer: Self-pay | Admitting: Vascular Surgery

## 2017-09-10 ENCOUNTER — Ambulatory Visit: Payer: Medicare Other | Admitting: Vascular Surgery

## 2017-09-11 ENCOUNTER — Telehealth: Payer: Self-pay | Admitting: *Deleted

## 2017-09-11 NOTE — Telephone Encounter (Signed)
Call to [facility]. Patient was no show for appt. 09/10/17 with Dr. Imogene Burn s/p request from Dr. Juel Burrow for revision of A-V graft. Dr. Imogene Burn will need to see patient in office before scheduling surgery. All of this info given to [place]. She will speak with patient today to stress importance of showing up when we resched. Informed her she can call me back today to take care of this.

## 2017-09-11 NOTE — Telephone Encounter (Signed)
Call to Marylene Land at [facility] with patient's office appointment time with Dr. Imogene Burn on Friday 09/14/17. Verified correct phone number in Epic. Marylene Land will be speaking with patient.

## 2017-09-14 ENCOUNTER — Encounter: Payer: Self-pay | Admitting: Vascular Surgery

## 2017-09-14 ENCOUNTER — Encounter: Payer: Self-pay | Admitting: *Deleted

## 2017-09-14 ENCOUNTER — Ambulatory Visit (INDEPENDENT_AMBULATORY_CARE_PROVIDER_SITE_OTHER): Payer: Self-pay | Admitting: Vascular Surgery

## 2017-09-14 ENCOUNTER — Other Ambulatory Visit: Payer: Self-pay | Admitting: *Deleted

## 2017-09-14 ENCOUNTER — Ambulatory Visit: Payer: Self-pay | Admitting: Vascular Surgery

## 2017-09-14 VITALS — BP 115/73 | HR 100 | Temp 98.0°F | Resp 18 | Ht 72.0 in | Wt 310.0 lb

## 2017-09-14 DIAGNOSIS — N186 End stage renal disease: Secondary | ICD-10-CM

## 2017-09-14 NOTE — Progress Notes (Signed)
TRASH NOTE  Subjective:     HPI:   REVIEW OF SYSTEMS      Objective:  Objective    Physical Exam  Data: junk     Assessment/Plan:     junk

## 2017-09-14 NOTE — Progress Notes (Signed)
    Established Dialysis Access  Pt is here to re-schedule her R arm angiogram with Dr. Randie Heinz.  This was set for her again after discussion with Dr. Caroline More, MD, FACS [facility] [place] Office: [phone] Pager: [phone]

## 2017-09-19 ENCOUNTER — Ambulatory Visit (HOSPITAL_COMMUNITY)
Admission: RE | Disposition: A | Discharge status: Home / Self Care | Payer: Self-pay | Source: Ambulatory Visit | Attending: Vascular Surgery

## 2017-09-19 ENCOUNTER — Telehealth: Payer: Self-pay | Admitting: Vascular Surgery

## 2017-09-19 ENCOUNTER — Ambulatory Visit (HOSPITAL_COMMUNITY)
Admission: RE | Admit: 2017-09-19 | Discharge: 2017-09-19 | Disposition: A | Discharge status: Home / Self Care | Payer: Medicare Other | Source: Ambulatory Visit | Attending: Vascular Surgery | Admitting: Vascular Surgery

## 2017-09-19 DIAGNOSIS — Z7901 Long term (current) use of anticoagulants: Secondary | ICD-10-CM | POA: Diagnosis not present

## 2017-09-19 DIAGNOSIS — Z992 Dependence on renal dialysis: Secondary | ICD-10-CM | POA: Insufficient documentation

## 2017-09-19 DIAGNOSIS — G2581 Restless legs syndrome: Secondary | ICD-10-CM | POA: Diagnosis not present

## 2017-09-19 DIAGNOSIS — N185 Chronic kidney disease, stage 5: Secondary | ICD-10-CM | POA: Diagnosis not present

## 2017-09-19 DIAGNOSIS — E1122 Type 2 diabetes mellitus with diabetic chronic kidney disease: Secondary | ICD-10-CM | POA: Insufficient documentation

## 2017-09-19 DIAGNOSIS — Z882 Allergy status to sulfonamides status: Secondary | ICD-10-CM | POA: Insufficient documentation

## 2017-09-19 DIAGNOSIS — Z6841 Body Mass Index (BMI) 40.0 and over, adult: Secondary | ICD-10-CM | POA: Diagnosis not present

## 2017-09-19 DIAGNOSIS — Y832 Surgical operation with anastomosis, bypass or graft as the cause of abnormal reaction of the patient, or of later complication, without mention of misadventure at the time of the procedure: Secondary | ICD-10-CM | POA: Insufficient documentation

## 2017-09-19 DIAGNOSIS — E039 Hypothyroidism, unspecified: Secondary | ICD-10-CM | POA: Diagnosis not present

## 2017-09-19 DIAGNOSIS — E1140 Type 2 diabetes mellitus with diabetic neuropathy, unspecified: Secondary | ICD-10-CM | POA: Diagnosis not present

## 2017-09-19 DIAGNOSIS — T82898A Other specified complication of vascular prosthetic devices, implants and grafts, initial encounter: Secondary | ICD-10-CM

## 2017-09-19 DIAGNOSIS — Z79899 Other long term (current) drug therapy: Secondary | ICD-10-CM | POA: Diagnosis not present

## 2017-09-19 DIAGNOSIS — N186 End stage renal disease: Secondary | ICD-10-CM | POA: Insufficient documentation

## 2017-09-19 DIAGNOSIS — Z880 Allergy status to penicillin: Secondary | ICD-10-CM | POA: Insufficient documentation

## 2017-09-19 DIAGNOSIS — Z89512 Acquired absence of left leg below knee: Secondary | ICD-10-CM | POA: Insufficient documentation

## 2017-09-19 DIAGNOSIS — I2782 Chronic pulmonary embolism: Secondary | ICD-10-CM | POA: Diagnosis not present

## 2017-09-19 DIAGNOSIS — Z9981 Dependence on supplemental oxygen: Secondary | ICD-10-CM | POA: Insufficient documentation

## 2017-09-19 HISTORY — PX: AORTIC ARCH ANGIOGRAPHY: CATH118224

## 2017-09-19 HISTORY — PX: PERIPHERAL VASCULAR BALLOON ANGIOPLASTY: CATH118281

## 2017-09-19 HISTORY — PX: LOWER EXTREMITY ANGIOGRAPHY: CATH118251

## 2017-09-19 HISTORY — PX: UPPER EXTREMITY ANGIOGRAPHY: CATH118270

## 2017-09-19 LAB — POCT I-STAT, CHEM 8
BUN: 44 mg/dL — AB (ref 6–20)
CREATININE: 10.5 mg/dL — AB (ref 0.44–1.00)
Calcium, Ion: 1.1 mmol/L — ABNORMAL LOW (ref 1.15–1.40)
Chloride: 94 mmol/L — ABNORMAL LOW (ref 101–111)
Glucose, Bld: 76 mg/dL (ref 65–99)
HEMATOCRIT: 41 % (ref 36.0–46.0)
Hemoglobin: 13.9 g/dL (ref 12.0–15.0)
POTASSIUM: 4.6 mmol/L (ref 3.5–5.1)
SODIUM: 139 mmol/L (ref 135–145)
TCO2: 32 mmol/L (ref 22–32)

## 2017-09-19 LAB — PROTIME-INR
INR: 1.17
Prothrombin Time: 14.8 seconds (ref 11.4–15.2)

## 2017-09-19 LAB — POCT ACTIVATED CLOTTING TIME
ACTIVATED CLOTTING TIME: 191 s
Activated Clotting Time: 235 seconds
Activated Clotting Time: 169 seconds

## 2017-09-19 LAB — GLUCOSE, CAPILLARY
Glucose-Capillary: 77 mg/dL (ref 65–99)
Glucose-Capillary: 49 mg/dL — ABNORMAL LOW (ref 65–99)
Glucose-Capillary: 60 mg/dL — ABNORMAL LOW (ref 65–99)
Glucose-Capillary: 69 mg/dL (ref 65–99)

## 2017-09-19 LAB — HCG, SERUM, QUALITATIVE: Preg, Serum: NEGATIVE

## 2017-09-19 SURGERY — UPPER EXTREMITY ANGIOGRAPHY
Anesthesia: LOCAL | Laterality: Right

## 2017-09-19 MED ORDER — HYDRALAZINE HCL 20 MG/ML IJ SOLN: 5.0000 mg | INTRAMUSCULAR | Status: DC | PRN

## 2017-09-19 MED ORDER — MIDAZOLAM HCL 2 MG/2ML IJ SOLN
INTRAMUSCULAR | Status: DC | PRN
  Administered 2017-09-19: 1 mg via INTRAVENOUS

## 2017-09-19 MED ORDER — MIDAZOLAM HCL 2 MG/2ML IJ SOLN
INTRAMUSCULAR | Status: AC
  Filled 2017-09-19: qty 2

## 2017-09-19 MED ORDER — HEPARIN (PORCINE) IN NACL 2-0.9 UNIT/ML-% IJ SOLN
INTRAMUSCULAR | Status: AC | PRN
  Administered 2017-09-19: 1000 mL

## 2017-09-19 MED ORDER — HYDROMORPHONE HCL 1 MG/ML IJ SOLN: 0.5000 mg | INTRAMUSCULAR | Status: DC | PRN

## 2017-09-19 MED ORDER — OXYCODONE HCL 5 MG PO TABS
ORAL_TABLET | ORAL | Status: AC
  Filled 2017-09-19: qty 2

## 2017-09-19 MED ORDER — LIDOCAINE HCL 2 % IJ SOLN
INTRAMUSCULAR | Status: AC
  Filled 2017-09-19: qty 10

## 2017-09-19 MED ORDER — SODIUM CHLORIDE 0.9% FLUSH: 3.0000 mL | Freq: Two times a day (BID) | INTRAVENOUS | Status: DC

## 2017-09-19 MED ORDER — OXYCODONE HCL 5 MG PO TABS
10.0000 mg | ORAL_TABLET | Freq: Once | ORAL | Status: AC
  Administered 2017-09-19: 10 mg via ORAL

## 2017-09-19 MED ORDER — SODIUM CHLORIDE 0.9 % IV SOLN: 250.0000 mL | INTRAVENOUS | Status: DC | PRN

## 2017-09-19 MED ORDER — HEPARIN SODIUM (PORCINE) 1000 UNIT/ML IJ SOLN
INTRAMUSCULAR | Status: DC | PRN
  Administered 2017-09-19: 3000 [IU] via INTRAVENOUS
  Administered 2017-09-19: 8000 [IU] via INTRAVENOUS

## 2017-09-19 MED ORDER — HEPARIN SODIUM (PORCINE) 1000 UNIT/ML IJ SOLN
INTRAMUSCULAR | Status: AC
  Filled 2017-09-19: qty 1

## 2017-09-19 MED ORDER — HEPARIN (PORCINE) IN NACL 2-0.9 UNIT/ML-% IJ SOLN
INTRAMUSCULAR | Status: AC
  Filled 2017-09-19: qty 500

## 2017-09-19 MED ORDER — FENTANYL CITRATE (PF) 100 MCG/2ML IJ SOLN
INTRAMUSCULAR | Status: AC
  Filled 2017-09-19: qty 2

## 2017-09-19 MED ORDER — FENTANYL CITRATE (PF) 100 MCG/2ML IJ SOLN
50.0000 ug | Freq: Once | INTRAMUSCULAR | Status: AC
  Administered 2017-09-19: 50 ug via INTRAVENOUS

## 2017-09-19 MED ORDER — SODIUM CHLORIDE 0.9% FLUSH: 3.0000 mL | INTRAVENOUS | Status: DC | PRN

## 2017-09-19 MED ORDER — OXYCODONE HCL 5 MG PO TABS
5.0000 mg | ORAL_TABLET | ORAL | Status: DC | PRN
  Administered 2017-09-19: 10 mg via ORAL

## 2017-09-19 MED ORDER — LABETALOL HCL 5 MG/ML IV SOLN: 10.0000 mg | INTRAVENOUS | Status: DC | PRN

## 2017-09-19 MED ORDER — LIDOCAINE HCL (PF) 1 % IJ SOLN
INTRAMUSCULAR | Status: DC | PRN
  Administered 2017-09-19: 5 mL
  Administered 2017-09-19: 10 mL

## 2017-09-19 MED ORDER — FENTANYL CITRATE (PF) 100 MCG/2ML IJ SOLN
INTRAMUSCULAR | Status: DC | PRN
  Administered 2017-09-19 (×3): 50 ug via INTRAVENOUS

## 2017-09-19 MED ORDER — OXYCODONE HCL 5 MG PO TABS
ORAL_TABLET | ORAL | Status: AC
  Administered 2017-09-19: 10 mg via ORAL
  Filled 2017-09-19: qty 2

## 2017-09-19 MED ORDER — IODIXANOL 320 MG/ML IV SOLN
INTRAVENOUS | Status: DC | PRN
  Administered 2017-09-19: 110 mL via INTRAVENOUS

## 2017-09-19 SURGICAL SUPPLY — 29 items
BAG SNAP BAND KOVER 36X36 (MISCELLANEOUS) ×6 IMPLANT
BALLN MUSTANG 5.0X40 135 (BALLOONS) ×3
BALLOON MUSTANG 5.0X40 135 (BALLOONS) ×2 IMPLANT
CATH ANGIO 5F BER2 100CM (CATHETERS) ×3 IMPLANT
CATH ANGIO 5F PIGTAIL 100CM (CATHETERS) ×6 IMPLANT
CATH INFINITI VERT 5FR 125CM (CATHETERS) ×3 IMPLANT
COVER DOME SNAP 22 D (MISCELLANEOUS) ×3 IMPLANT
COVER PRB 48X5XTLSCP FOLD TPE (BAG) ×2 IMPLANT
COVER PROBE 5X48 (BAG) ×1
DEVICE CLOSURE PERCLS PRGLD 6F (VASCULAR PRODUCTS) ×2 IMPLANT
DEVICE CONTINUOUS FLUSH (MISCELLANEOUS) ×3 IMPLANT
DEVICE TORQUE .025-.038 (MISCELLANEOUS) ×3 IMPLANT
GUIDEWIRE ANGLED .035X260CM (WIRE) ×3 IMPLANT
KIT ENCORE 26 ADVANTAGE (KITS) ×3 IMPLANT
KIT MICROINTRODUCER STIFF 5F (SHEATH) ×3 IMPLANT
KIT PV (KITS) ×3 IMPLANT
PERCLOSE PROGLIDE 6F (VASCULAR PRODUCTS) ×3
SHEATH GUIDING CAROTID 6FRX90 (SHEATH) ×3 IMPLANT
SHEATH PINNACLE 5F 10CM (SHEATH) ×3 IMPLANT
SHEATH PINNACLE 6F 10CM (SHEATH) ×3 IMPLANT
SHEATH PINNACLE 7F 10CM (SHEATH) ×3 IMPLANT
SHEATH SHUTTLE SELECT 6F (SHEATH) ×3 IMPLANT
SYR MEDRAD MARK V 150ML (SYRINGE) ×3 IMPLANT
TRANSDUCER W/STOPCOCK (MISCELLANEOUS) ×3 IMPLANT
TRAY PV CATH (CUSTOM PROCEDURE TRAY) ×3 IMPLANT
TUBING HIGH PRESSURE 120CM (CONNECTOR) ×3 IMPLANT
WIRE BENTSON .035X145CM (WIRE) ×3 IMPLANT
WIRE HI TORQ VERSACORE 300 (WIRE) ×3 IMPLANT
WIRE HI TORQ VERSACORE J 260CM (WIRE) ×3 IMPLANT

## 2017-09-19 NOTE — H&P (Signed)
Patient ID: Christina Walters, female   DOB: 1971/05/17, 46 y.o.   MRN: [id number]  Reason for Consult: Re-evaluation (R UE steal study. C/o pain in R 5th finger and lateral hand. )   Referred by Sharlene DoryWendling, Nicholas Paul*  Subjective:     HPI:  Christina Walters is a 46 y.o. female patient with end-stage renal disease having had multiple upper extremity accesses as well as a left thigh graft that was removed for infection. She also has a left below-knee amputation and does walk with prosthesis. We placed a right upper extremity loop graft several months ago as well as a tunneled dialysis catheter which is now been removed. She continues to dialyze via the right upper extremity AV graft but has been having pain for the past several months in the right hand and arm especially on dialysis. She is able to alleviate some of the pain with tizanidine but most of it is constant and nonradiating and cannot be alleviated fully. Exacerbating factors include dialysis. She is not having other issues related to today's visit. He is on Coumadin for a recent history of pulmonary embolus.      Past Medical History:  Diagnosis Date  . Anemia   . Anxiety   . Arthritis    "left hand" (05/16/2016)  . Chronic lower back pain   . Constipation   . Depression   . Dysrhythmia    irregular heart rate - A- fib   . ESRD (end stage renal disease) on dialysis [facility](HCC)    "MWF; [facility]" (05/16/2016)  . History of blood transfusion 2012   "related to leg OR"  . Hypothyroidism   . Morbid obesity (HCC) 08/24/2016  . Neuropathy   . On home oxygen therapy    "2-3L when I sleep at night" (05/16/2016)  . OSA (obstructive sleep apnea)    "waiting to get my test done cause I need a new machine" (05/16/2016)using O2  . Pulmonary embolism (HCC) 05/15/2016   "small one"  . Pulmonary embolism (HCC) 2017   on Coumadin  . Restless legs syndrome   . Type II diabetes mellitus (HCC) dx'd 1981     controlled, not on medications        Family History  Problem Relation Age of Onset  . Hypertension Mother   . Kidney Stones Mother   . Arthritis Mother   . Non-Hodgkin's lymphoma Father         Past Surgical History:  Procedure Laterality Date  . ARTERIOVENOUS GRAFT PLACEMENT Bilateral   . AV FISTULA PLACEMENT Right 07/25/2016   Procedure: INSERTION OF ARTERIOVENOUS (AV) GORE-TEX GRAFT RIGHT ARM;  Surgeon: Maeola HarmanBrandon Christopher Jayke Caul, MD;  [facility];  Service: Vascular;  Laterality: Right;  . BELOW KNEE LEG AMPUTATION Left 2012  . DEBRIDMENT OF DECUBITUS ULCER Bilateral ~ 2011   "hips"  . DILATION AND CURETTAGE OF UTERUS    . ESOPHAGOGASTRODUODENOSCOPY    . INCISION AND DRAINAGE OF WOUND Right ~ 2011   "bubble mass in my thigh"  . INSERTION OF DIALYSIS CATHETER Left 07/25/2016   Procedure: EXCHANGE RIGHT TO LEFT CHEST DIALYSIS CATHETER;  Surgeon: Maeola HarmanBrandon Christopher Cana Mignano, MD;  [facility];  Service: Vascular;  Laterality: Left;  . PERIPHERAL VASCULAR CATHETERIZATION N/A 07/03/2016   Procedure: Upper Extremity Venography;  Surgeon: Chuck Hinthristopher S Dickson, MD;  [facility];  Service: Cardiovascular;  Laterality: N/A;  . THROMBECTOMY / ARTERIOVENOUS GRAFT REVISION Bilateral     Short Social  History:      Social History  Substance Use Topics  . Smoking status: Never Smoker  . Smokeless tobacco: Never Used  . Alcohol use No         Allergies  Allergen Reactions  . Penicillins Hives and Itching    Has patient had a PCN reaction causing immediate rash, facial/tongue/throat swelling, SOB or lightheadedness with hypotension: Yes Has patient had a PCN reaction causing severe rash involving mucus membranes or skin necrosis: No Has patient had a PCN reaction that required hospitalization No Has patient had a PCN reaction occurring within the last 10 years: No If all of the above answers are "NO", then may proceed with Cephalosporin  use.   . Sulfa Antibiotics Hives and Itching  . Sulfamethoxazole-Trimethoprim Hives and Itching  . Fentanyl     Lethargic          Current Outpatient Prescriptions  Medication Sig Dispense Refill  . cinacalcet (SENSIPAR) 30 MG tablet Take 30 mg by mouth daily with breakfast.     . diclofenac sodium (VOLTAREN) 1 % GEL Apply to affected area on back three times daily as needed. 1 Tube 3  . Docusate Sodium 100 MG capsule Take 100 mg by mouth 2 (two) times daily as needed for constipation.     . DULoxetine (CYMBALTA) 30 MG capsule Take 1 capsule (30 mg total) by mouth daily. Take with a 60 mg capsule for a 90 mg dose 30 capsule 2  . DULoxetine (CYMBALTA) 60 MG capsule Take 1 capsule (60 mg total) by mouth daily. Take with a 30 mg capsule for a 90 mg dose 30 capsule 2  . gabapentin (NEURONTIN) 300 MG capsule 1 cap in AM, 1 cap in afternoon, 2 caps in evening. (Patient taking differently: Take 300 mg by mouth See admin instructions. 1 cap in AM, 1 cap in afternoon, 2 caps in evening.) 120 capsule 2  . HYDROcodone-acetaminophen (NORCO) 10-325 MG tablet Take 1 tablet by mouth every 6 (six) hours as needed for moderate pain. 20 tablet 0  . hydrOXYzine (ATARAX/VISTARIL) 50 MG tablet Take 1 tablet (50 mg total) by mouth 3 (three) times daily as needed for anxiety or itching. 30 tablet 2  . levothyroxine (SYNTHROID, LEVOTHROID) 88 MCG tablet Take 88 mcg by mouth daily before breakfast.     . midodrine (PROAMATINE) 10 MG tablet Take 1 tablet by mouth 3 (three) times daily.    . mirtazapine (REMERON) 7.5 MG tablet Take 7.5 mg by mouth at bedtime.     . multivitamin (RENA-VIT) TABS tablet Take 1 tablet by mouth at bedtime. 30 tablet 0  . OXYGEN Inhale 2 L into the lungs at bedtime.    Marland Kitchen tiZANidine (ZANAFLEX) 4 MG tablet Take 1 tablet (4 mg total) by mouth every 6 (six) hours as needed for muscle spasms. 30 tablet 0  . traZODone (DESYREL) 50 MG tablet TAKE ONE TABLET BY MOUTH ONCE DAILY AT  BEDTIME AS NEEDED FOR SLEEP 60 tablet 0  . warfarin (COUMADIN) 5 MG tablet Take 1 tablet (5 mg total) by mouth daily at 6 PM. (Patient taking differently: Take 5-7.5 mg by mouth daily at 6 PM. ) 30 tablet 0  . zolpidem (AMBIEN) 5 MG tablet Take 5 mg by mouth at bedtime.     No current facility-administered medications for this visit.     Review of Systems  Constitutional:  Constitutional negative. Musculoskeletal:       Hand pain Hematologic: Hematologic/lymphatic negative.  Objective:  Objective      Vitals:   03/30/17 0914  BP: 110/64  Pulse: 88  Resp: 20  Temp: 98 F (36.7 C)  TempSrc: Oral  SpO2: 92%  Weight: (!) 316 lb (143.3 kg)  Height: 6' (1.829 m)   Body mass index is 42.86 kg/m.  Physical Exam  Constitutional: She appears well-developed.  HENT:  Head: Normocephalic.  Cardiovascular: Normal rate.   Signal in right radial and ulnar arteries improves with compression of graft  Pulmonary/Chest: Effort normal.  Abdominal: Soft.  Musculoskeletal:  Right hand without ischemic changes  Skin: Skin is warm and dry.    Data: I have independently read and interpreted her right upper extremity steal study today that has a resting right radial artery pressure of 79 mmHg that improves to 146 with compression of her graft. This gives her marginal steal.     Assessment/Plan:  46yo female With multiple previous access procedures now with a right upper extremity AV graft which is causing pain in her hand specifically her index finger exacerbated with hd. Marginal previous steal study. Aortogram with rue runoff today.    Maeola Harman MD [facility]

## 2017-09-19 NOTE — Telephone Encounter (Signed)
-----   Message from Sharee PimpleMarilyn K McChesney, RN sent at 09/19/2017 12:32 PM EDT ----- Regarding: 4-6 weeks w/ steal study   ----- Message ----- From: Maeola Harmanain, Brandon Christopher, MD Sent: 09/19/2017  12:24 PM To: [address]Vvs Charge Pool  [name] [id number] 1971-08-11  09/19/2017 Pre-operative Diagnosis: right upper extremity steal   Surgeon:  Apolinar JunesBrandon C. Randie Heinzain, MD  Procedure Performed: 1.  US guided cannulation of right common femoral artery 2.  Arch aortogram with right upper extremity angiogram 3.  Balloon angioplasty of right axillary artery with 5 x 40mm balloon 4.  Right lower extremity angiogram 5.  Moderate sedation with fentanyl and versed for 73minutes 6.  Attempted percutaneous closure of right common femoral artery  F/u in 4-6 weeks with right arm steal study

## 2017-09-19 NOTE — Op Note (Signed)
Patient name: Christina Walters MRN: [id number] DOB: January 30, 1971 Sex: female  09/19/2017 Pre-operative Diagnosis: right upper extremity steal  Post-operative diagnosis:  Same Surgeon:  Apolinar JunesBrandon C. Randie Heinzain, MD Procedure Performed: 1.  US guided cannulation of right common femoral artery 2.  Arch aortogram with right upper extremity angiogram 3.  Balloon angioplasty of right axillary artery with 5 x 40mm balloon 4.  Right lower extremity angiogram 5.  Moderate sedation with fentanyl and versed for 73minutes 6.  Attempted percutaneous closure of right common femoral artery   Indications:  46yo female with end-stage renal disease with most recently on dialysis via right upper arm AV graft. She does have pain in the right hand exacerbated with dialysis and has a marginal steal study and is indicated for right upper extremity angiogram possible intervention. I'll also schedule her for right lower extremity angiogram to plan future access should that be required.  Findings: patient has a bovine arch configuration. Just proximal to the takeoff of the graft from the arterial anastomosis there is a 80% stenosis likely from clamp injury. The graft itself is patent but does have approximately 50% venous anastomotic stenosis. There is 0 flow past the arterial anastomosis on proximal angiogram. Following balloon angioplasty there is resolution of the stenosis to 0% and there is flow at least to the level of the elbow when contrast was injected proximal to the anastomosis. The right lower extremity has brisk runoff without any flow-limiting stenosis and likely has 3 vessel runoff to the foot which is incompletely evaluated given timing of contrast.   Procedure:  The patient was identified in the holding area and taken to room 8.  The patient was then placed supine on the table and prepped and draped in the usual sterile fashion.  A time out was called.  Ultrasound was used to evaluate the right common  femoral artery.  It was patent .  A digital ultrasound image was acquired.  A micropuncture needle was used to access the right common femoral artery under ultrasound guidance.  An 018 wire was advanced without resistance and a micropuncture sheath was placed.  The 018 wire was removed and a benson wire was placed.  The micropuncture sheath was exchanged for a 5 french sheath.  A Bentson wire and pigtail catheter were advanced into the ascending aorta and arch aortogram was performed with bovine arch configuration. Following this a patient was given 3000 units of heparin and the innominate artery was selected with bare catheter and Glidewire and this was placed into the axillary artery. Right upper extremity angiogram was then performed with the above findings. We then exchanged for a long 6 French sheath and the patient was fully heparinized and ACT of 245. We then performed balloon angioplasty for 2 minutes with a 5 x 40 mm balloon. Completion angiogram demonstrated resolution of the stenosis and contrast flowed to release the level of the elbow when injected proximal to the arterial anastomosis and the patient had a weak palpable radial pulse that was not there prior. Satisfied with this we retracted the sheath into the right external iliac artery performed right lower extremity angiogram without any evidence of disease. Then exchanged for Bentson wire attempted to deploy a provide device but this failed. We then exchanged for a sort [place] sheath the wires removed and the sheath will be pulled and postop ordering when ACT less than 180. Patient tolerated procedure well without immediate complication.   Contrast: 110cc   Christina Walters C. Randie Heinzain,  MD [facility] Office: [phone] Pager: [phone]

## 2017-09-19 NOTE — Telephone Encounter (Signed)
Sched appt 10/19/17; lab at 3:30 and MD at 4:00. Lm on hm#.

## 2017-09-19 NOTE — Progress Notes (Addendum)
Site area: RFA Site Prior to Removal:  Level 0 Pressure Applied For: 70 min Manual:   yes Patient Status During Pull:  stable Post Pull Site:  Level 1 Post Pull Instructions Given:  yes Post Pull Pulses Present: doppler Dressing Applied:  tegaderm Bedrest begins @ 1530 [phone] Comments: Growth seen after 30 min of Pressure and stable results. Pressure  Reapplied. Thereasa DistanceRodney Cox in to relieve at 58 min mark. Dr Randie Heinzain in and approved short stay /out patient status Post 4h bed rest if stable.

## 2017-09-19 NOTE — Discharge Instructions (Signed)
Angiogram, Care After °This sheet gives you information about how to care for yourself after your procedure. Your health care provider may also give you more specific instructions. If you have problems or questions, contact your health care provider. °What can I expect after the procedure? °After the procedure, it is common to have bruising and tenderness at the catheter insertion area. °Follow these instructions at home: °Insertion site care  °· Follow instructions from your health care provider about how to take care of your insertion site. Make sure you: °¨ Wash your hands with soap and water before you change your bandage (dressing). If soap and water are not available, use hand sanitizer. °¨ Change your dressing as told by your health care provider. °¨ Leave stitches (sutures), skin glue, or adhesive strips in place. These skin closures may need to stay in place for 2 weeks or longer. If adhesive strip edges start to loosen and curl up, you may trim the loose edges. Do not remove adhesive strips completely unless your health care provider tells you to do that. °· Do not take baths, swim, or use a hot tub until your health care provider approves. °· You may shower 24-48 hours after the procedure or as told by your health care provider. °¨ Gently wash the site with plain soap and water. °¨ Pat the area dry with a clean towel. °¨ Do not rub the site. This may cause bleeding. °· Do not apply powder or lotion to the site. Keep the site clean and dry. °· Check your insertion site every day for signs of infection. Check for: °¨ Redness, swelling, or pain. °¨ Fluid or blood. °¨ Warmth. °¨ Pus or a bad smell. °Activity  °· Rest as told by your health care provider, usually for 1-2 days. °· Do not lift anything that is heavier than 10 lbs. (4.5 kg) or as told by your health care provider. °· Do not drive for 24 hours if you were given a medicine to help you relax (sedative). °· Do not drive or use heavy machinery while  taking prescription pain medicine. °General instructions  °· Return to your normal activities as told by your health care provider, usually in about a week. Ask your health care provider what activities are safe for you. °· If the catheter site starts bleeding, lie flat and put pressure on the site. If the bleeding does not stop, get help right away. This is a medical emergency. °· Drink enough fluid to keep your urine clear or pale yellow. This helps flush the contrast dye from your body. °· Take over-the-counter and prescription medicines only as told by your health care provider. °· Keep all follow-up visits as told by your health care provider. This is important. °Contact a health care provider if: °· You have a fever or chills. °· You have redness, swelling, or pain around your insertion site. °· You have fluid or blood coming from your insertion site. °· The insertion site feels warm to the touch. °· You have pus or a bad smell coming from your insertion site. °· You have bruising around the insertion site. °· You notice blood collecting in the tissue around the catheter site (hematoma). The hematoma may be painful to the touch. °Get help right away if: °· You have severe pain at the catheter insertion area. °· The catheter insertion area swells very fast. °· The catheter insertion area is bleeding, and the bleeding does not stop when you hold steady pressure on   the area. °· The area near or just beyond the catheter insertion site becomes pale, cool, tingly, or numb. °These symptoms may represent a serious problem that is an emergency. Do not wait to see if the symptoms will go away. Get medical help right away. Call your local emergency services (911 in the [place] Do not drive yourself to the hospital. °Summary °· After the procedure, it is common to have bruising and tenderness at the catheter insertion area. °· After the procedure, it is important to rest and drink plenty of fluids. °· Do not take baths,  swim, or use a hot tub until your health care provider says it is okay to do so. You may shower 24-48 hours after the procedure or as told by your health care provider. °· If the catheter site starts bleeding, lie flat and put pressure on the site. If the bleeding does not stop, get help right away. This is a medical emergency. °This information is not intended to replace advice given to you by your health care provider. Make sure you discuss any questions you have with your health care provider. °Document Released: 06/08/2005 Document Revised: 10/25/2016 Document Reviewed: 10/25/2016 °Elsevier Interactive Patient Education © 2017 [employer] ° °

## 2017-09-19 NOTE — Progress Notes (Signed)
Pt is eating and denies any symptoms of low glucose, will continue to monitor.

## 2017-09-20 ENCOUNTER — Encounter (HOSPITAL_COMMUNITY): Payer: Self-pay | Admitting: Vascular Surgery

## 2017-09-20 MED FILL — Fentanyl Citrate Preservative Free (PF) Inj 100 MCG/2ML: INTRAMUSCULAR | Qty: 2 | Status: AC

## 2017-09-20 MED FILL — Lidocaine HCl Local Inj 2%: INTRAMUSCULAR | Qty: 20 | Status: AC

## 2017-09-24 ENCOUNTER — Other Ambulatory Visit: Payer: Self-pay

## 2017-09-24 DIAGNOSIS — Z992 Dependence on renal dialysis: Principal | ICD-10-CM

## 2017-09-24 DIAGNOSIS — N186 End stage renal disease: Secondary | ICD-10-CM

## 2017-09-24 DIAGNOSIS — Z48812 Encounter for surgical aftercare following surgery on the circulatory system: Secondary | ICD-10-CM

## 2017-10-06 IMAGING — CT CT ANGIO CHEST
2 of 6 series · 18 of 36 positions shown · IV contrast (Omni 300)
Comparison: Chest radiograph May 15, 2016

CLINICAL DATA: Dizziness and syncopal episodes for 1 week. Deep
vein thrombosis noted at vascular centered to during catheter
exchange. History of end-stage renal disease on dialysis and,
diabetes.

EXAM:
CT ANGIOGRAPHY CHEST WITH CONTRAST
TECHNIQUE: Multidetector CT imaging of the chest was performed using the
standard protocol during bolus administration of intravenous
contrast. Multiplanar CT image reconstructions and MIPs were
obtained to evaluate the vascular anatomy.
CONTRAST:  100 cc Isovue 370

[Series 6: pe thins · axial · 0.80mm/px · z∈[-338,-50]mm · 17 of 637 slices shown]
[im 31/637  lung]
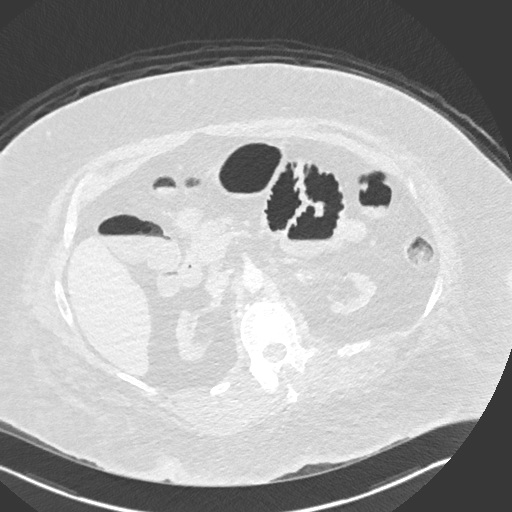
[im 61/637  mediastinal]
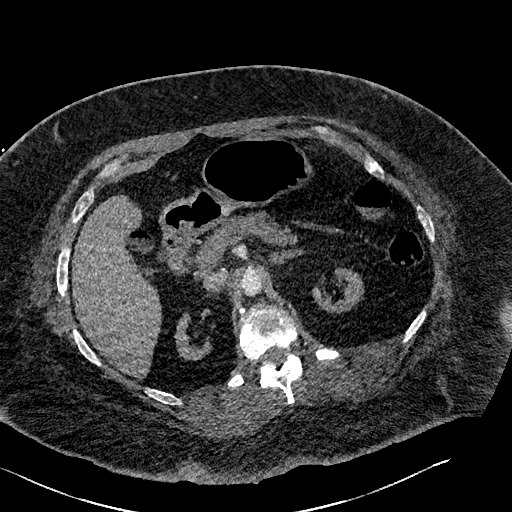
[im 91/637  lung]
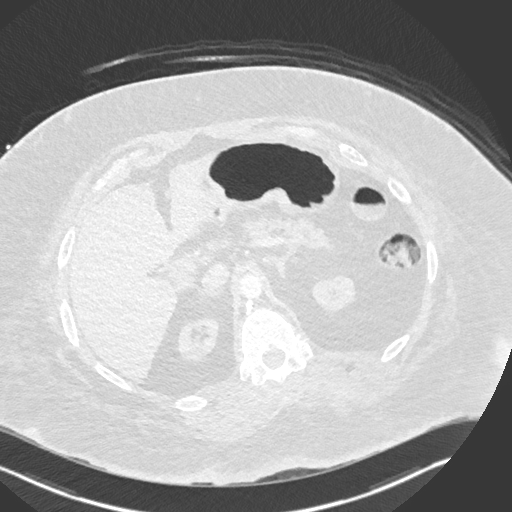
[im 152/637  mediastinal]
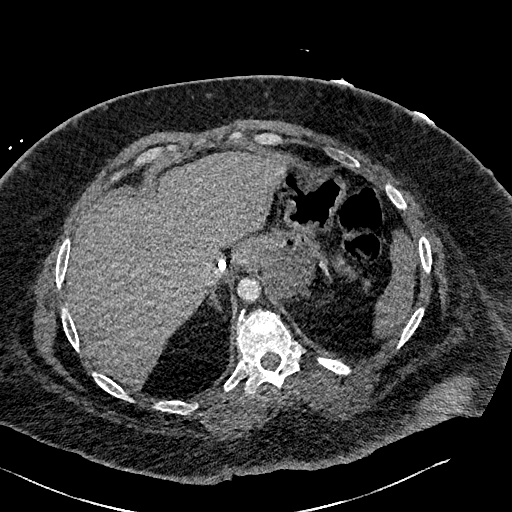
[im 182/637  lung]
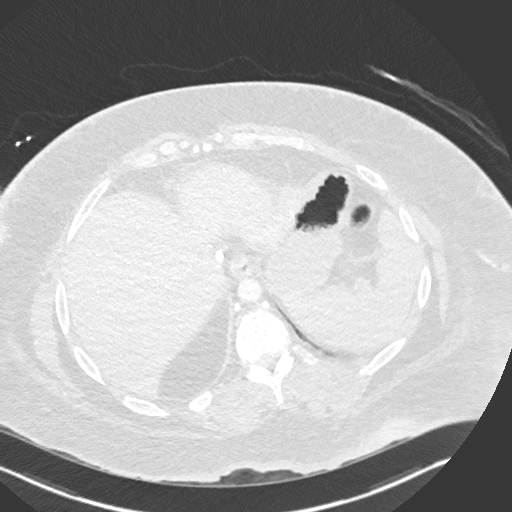
[im 213/637  mediastinal]
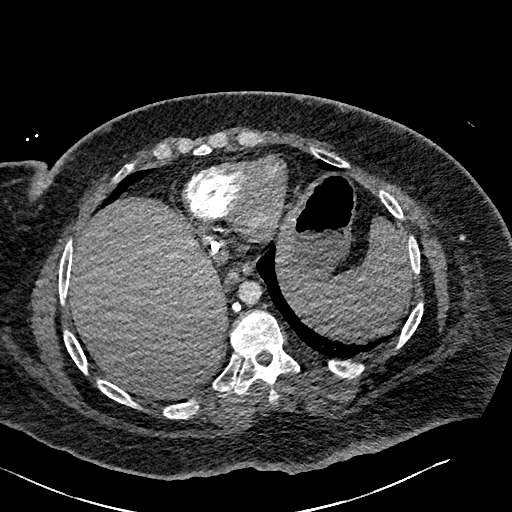
[im 243/637  lung]
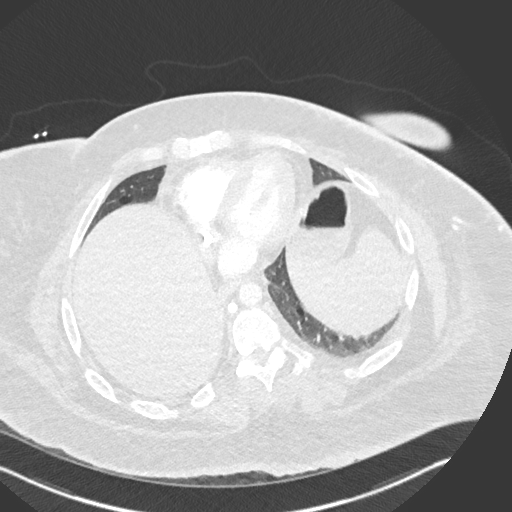
[im 273/637  mediastinal]
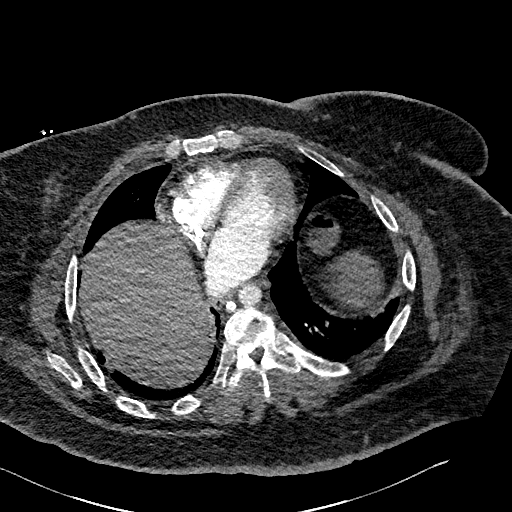
[im 334/637  lung]
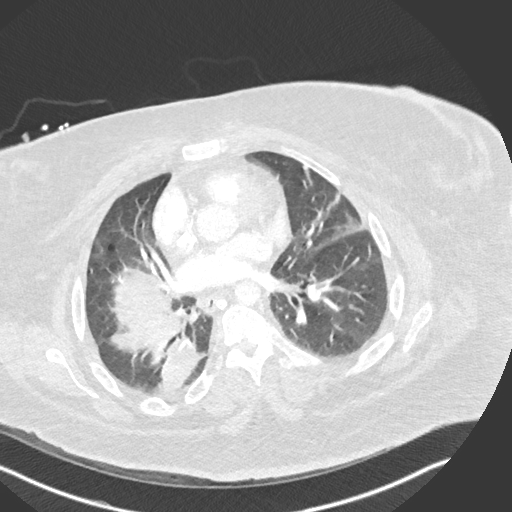
[im 364/637  mediastinal]
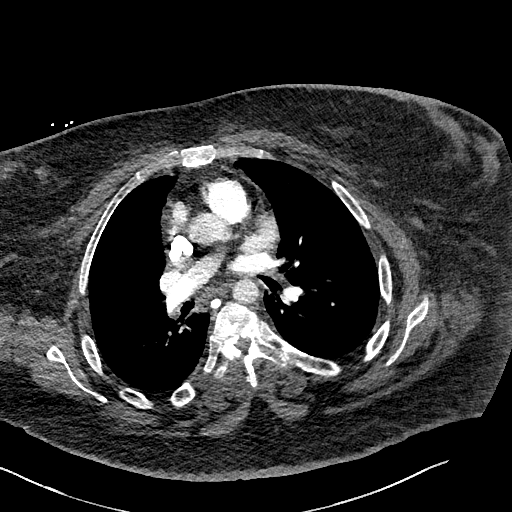
[im 394/637  lung]
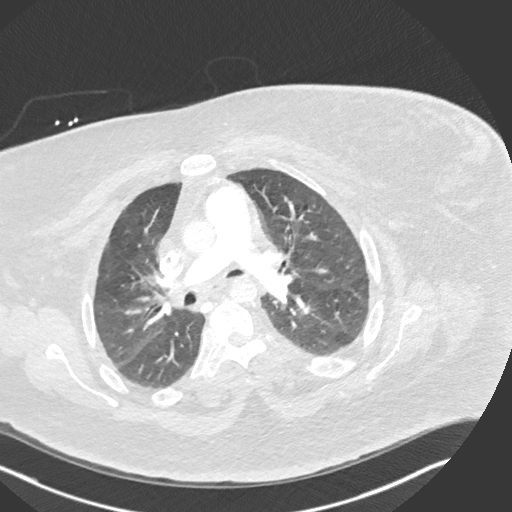
[im 425/637  mediastinal]
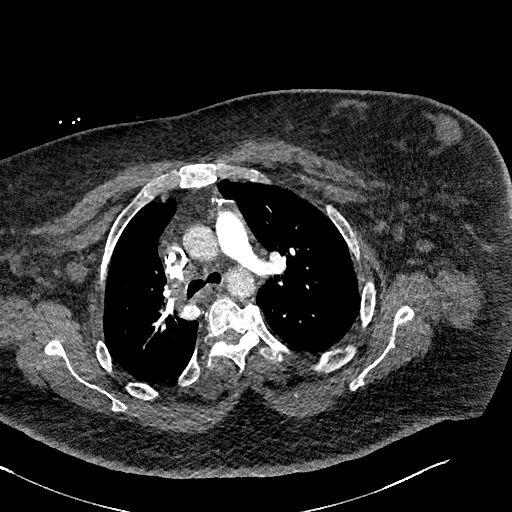
[im 455/637  lung]
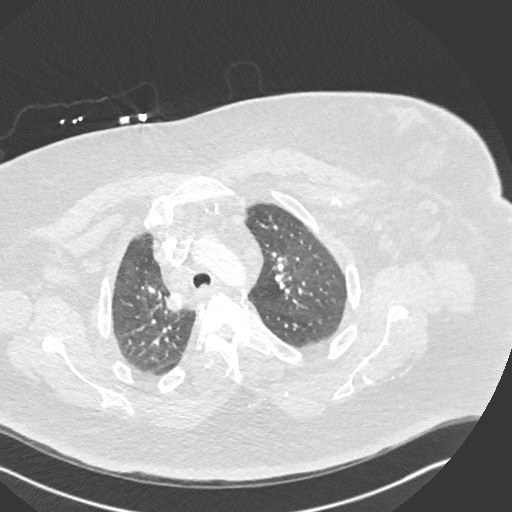
[im 485/637  mediastinal]
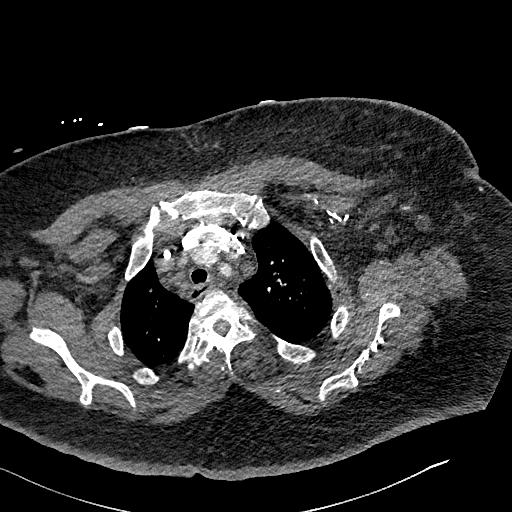
[im 546/637  lung]
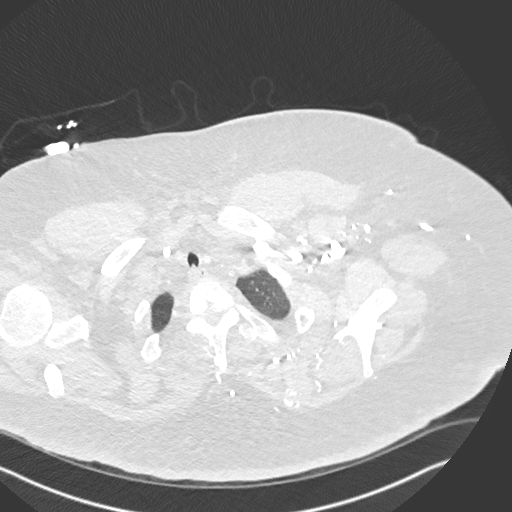
[im 576/637  mediastinal]
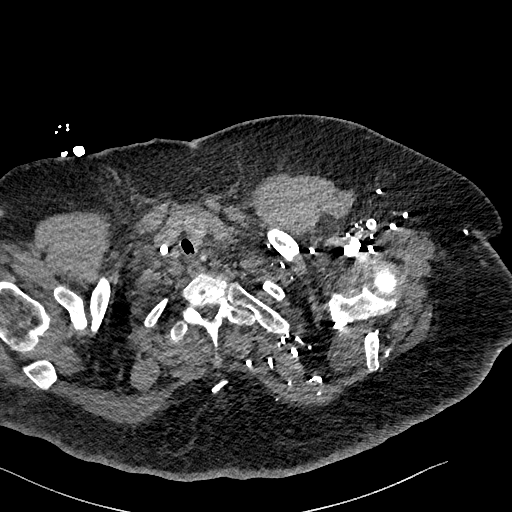
[im 606/637  lung]
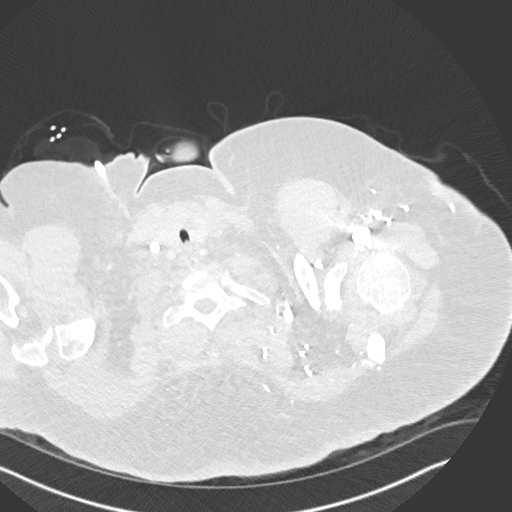

[Series 7: pe 2mm cor · coronal · 0.62mm/px · 1 of 129 slices shown]
[im 65/129  mediastinal]
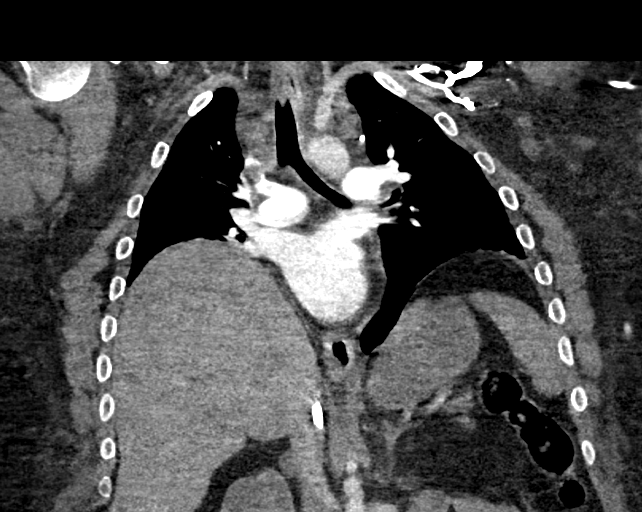

[18 of 36 positions shown; findings below may reference images not displayed]

FINDINGS: Large body habitus results in overall noisy image quality.

PULMONARY ARTERY: Adequate contrast opacification of the pulmonary
artery's. Main pulmonary artery is not enlarged. Nearly occlusive
RIGHT lower lobe pulmonary artery embolism casting into the
segmental branches.

MEDIASTINUM: Heart and pericardium are unremarkable, no right heart
strain. Thoracic aorta is normal course and caliber, unremarkable.
No lymphadenopathy by CT size criteria. RIGHT internal jugular
central venous catheter distal tip and knee inferior vena cava,
below the intrahepatic IVC. Contrast outlining probable thrombus in
the superior vena cava and brachiocephalic confluence.

LUNGS: Tracheobronchial tree is patent, no pneumothorax. RIGHT lower
lobe atelectasis. Elevated RIGHT hemidiaphragm.

SOFT TISSUES AND OSSEOUS STRUCTURES: Atrophic native kidneys.
Multilevel Schmorl's nodes without acute osseous process. Broad
thoracolumbar levoscoliosis.

Review of the MIP images confirms the above findings.
IMPRESSION: Acute RIGHT lower lobe nonocclusive pulmonary embolism. RIGHT lower
lobe atelectasis and possible infarct though, less likely.

Superior vena cava thrombosis casting to the brachiocephalic
confluence.

Dialysis catheter via RIGHT internal jugular venous approach, distal
tip in inferior vena cava.

Acute findings discussed with and reconfirmed by Dr.CHARBEL CHALHOUB on
05/16/2016 at [DATE].

## 2017-10-19 ENCOUNTER — Inpatient Hospital Stay (HOSPITAL_COMMUNITY): Admit: 2017-10-19 | Payer: Self-pay

## 2017-10-19 ENCOUNTER — Ambulatory Visit: Payer: Medicare Other | Admitting: Vascular Surgery

## 2018-03-06 ENCOUNTER — Other Ambulatory Visit: Payer: Self-pay

## 2018-03-06 DIAGNOSIS — L97513 Non-pressure chronic ulcer of other part of right foot with necrosis of muscle: Secondary | ICD-10-CM

## 2018-03-06 DIAGNOSIS — I70235 Atherosclerosis of native arteries of right leg with ulceration of other part of foot: Secondary | ICD-10-CM

## 2018-03-14 ENCOUNTER — Ambulatory Visit: Payer: Medicare Other | Admitting: Vascular Surgery

## 2018-03-14 ENCOUNTER — Encounter (HOSPITAL_COMMUNITY): Payer: Self-pay

## 2018-04-12 ENCOUNTER — Encounter (HOSPITAL_COMMUNITY): Payer: Self-pay

## 2018-04-12 ENCOUNTER — Ambulatory Visit: Payer: Self-pay | Admitting: Vascular Surgery

## 2018-04-15 ENCOUNTER — Other Ambulatory Visit: Payer: Self-pay

## 2018-04-15 ENCOUNTER — Inpatient Hospital Stay (HOSPITAL_COMMUNITY)
Admission: RE | Admit: 2018-04-15 | Discharge: 2018-04-15 | Disposition: A | Discharge status: Home / Self Care | Payer: Self-pay | Source: Ambulatory Visit | Attending: Vascular Surgery | Admitting: Vascular Surgery

## 2018-04-15 DIAGNOSIS — L97513 Non-pressure chronic ulcer of other part of right foot with necrosis of muscle: Secondary | ICD-10-CM

## 2018-04-15 DIAGNOSIS — I70235 Atherosclerosis of native arteries of right leg with ulceration of other part of foot: Secondary | ICD-10-CM

## 2018-04-17 ENCOUNTER — Ambulatory Visit: Payer: Medicare Other

## 2018-04-19 ENCOUNTER — Other Ambulatory Visit: Payer: Self-pay

## 2018-04-19 ENCOUNTER — Encounter: Payer: Self-pay | Admitting: Physician Assistant

## 2018-04-19 ENCOUNTER — Ambulatory Visit (INDEPENDENT_AMBULATORY_CARE_PROVIDER_SITE_OTHER): Payer: Medicare Other | Admitting: Physician Assistant

## 2018-04-19 ENCOUNTER — Ambulatory Visit (HOSPITAL_COMMUNITY)
Admission: RE | Admit: 2018-04-19 | Discharge: 2018-04-19 | Disposition: A | Discharge status: Home / Self Care | Payer: Medicare Other | Source: Ambulatory Visit | Attending: Vascular Surgery | Admitting: Vascular Surgery

## 2018-04-19 VITALS — BP 110/70 | HR 88 | Temp 98.0°F | Resp 14 | Ht 72.0 in | Wt 303.0 lb

## 2018-04-19 DIAGNOSIS — L97513 Non-pressure chronic ulcer of other part of right foot with necrosis of muscle: Secondary | ICD-10-CM | POA: Diagnosis not present

## 2018-04-19 DIAGNOSIS — N186 End stage renal disease: Secondary | ICD-10-CM

## 2018-04-19 DIAGNOSIS — E11621 Type 2 diabetes mellitus with foot ulcer: Secondary | ICD-10-CM

## 2018-04-19 DIAGNOSIS — I70235 Atherosclerosis of native arteries of right leg with ulceration of other part of foot: Secondary | ICD-10-CM

## 2018-04-19 DIAGNOSIS — Z89512 Acquired absence of left leg below knee: Secondary | ICD-10-CM | POA: Insufficient documentation

## 2018-04-19 DIAGNOSIS — Z992 Dependence on renal dialysis: Secondary | ICD-10-CM

## 2018-04-19 DIAGNOSIS — I739 Peripheral vascular disease, unspecified: Secondary | ICD-10-CM

## 2018-04-19 DIAGNOSIS — L97519 Non-pressure chronic ulcer of other part of right foot with unspecified severity: Secondary | ICD-10-CM | POA: Diagnosis not present

## 2018-04-19 NOTE — Progress Notes (Signed)
Established Dialysis Access   History of Present Illness   Christina Walters is a 47 y.o. (1971/02/12) female who presents to clinic due to fifth toe right foot ulceration.  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  Past medical history also significant for diabetes mellitus as well as end-stage renal disease on hemodialysis.  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  She has chronic right hand pain since placement of right upper arm loop graft however due to her limited dialysis access she has chosen to put up with the pain of steal syndrome and has been dialyzing without complication from right arm AV graft on a Tuesday Thursday Saturday schedule.  She underwent fistulogram and right arm by Dr. Randie Heinz in October 2018.  During this procedure Dr. Randie Heinz also performed a diagnostic right lower extremity arteriogram in the event right upper arm graft would be ligated due to steal syndrome and she would potentially need right thigh graft.  At that time there is no large vessel occlusion noted in right lower extremity.  Distal ankle and foot arterial anatomy was not well visualized due to timing of contrast.  Duplex today demonstrates patent three-vessel runoff to the level of the ankle where PTA likely occludes.  She is receiving wound care at [facility] regional wound clinic weekly.  She believes her fifth toe ulceration is improving.  Plain film of right foot 2 days ago however shows early signs of ostial myelitis.  She denies fevers chills, nausea/vomiting.  She denies drainage from left toe ulcer.   Current Outpatient Medications  Medication Sig Dispense Refill  . acetaminophen (TYLENOL) 500 MG tablet Take 1,000 mg by mouth 2 (two) times daily as needed for moderate pain.    . cinacalcet (SENSIPAR) 30 MG tablet Take 30 mg by mouth daily with  supper.     . diclofenac sodium (VOLTAREN) 1 % GEL Apply to affected area on back three times daily as needed. (Patient taking differently: Apply to affected area on back three times daily as needed pain) 1 Tube 3  . docusate sodium (COLACE) 100 MG capsule Take 200 mg by mouth 2 (two) times daily as needed for mild constipation.    . DULoxetine (CYMBALTA) 30 MG capsule Take 1 capsule (30 mg total) by mouth daily. Take with a 60 mg capsule for a 90 mg dose 30 capsule 2  . DULoxetine (CYMBALTA) 60 MG capsule Take 1 capsule (60 mg total) by mouth daily. Take with a 30 mg capsule for a 90 mg dose 30 capsule 2  . gabapentin (NEURONTIN) 300 MG capsule 1 cap in AM, 1 cap in afternoon, 2 caps in evening. (Patient taking differently: Take 300-600 mg by mouth See admin instructions.  in AM,  in afternoon,   in evening.) 120 capsule 2  . HYDROcodone-acetaminophen (NORCO) 10-325 MG tablet Take 1 tablet by mouth every 6 (six) hours as needed for moderate pain. 20 tablet 0  . levothyroxine (SYNTHROID, LEVOTHROID) 88 MCG tablet Take 88 mcg by mouth at bedtime.     Marland Kitchen loperamide (IMODIUM A-D) 2 MG tablet Take 4 mg by mouth daily as needed for diarrhea or loose stools.    . midodrine (PROAMATINE) 10 MG tablet Take 10 mg by mouth 3 (three) times daily.     . multivitamin (RENA-VIT) TABS tablet Take 1 tablet  by mouth at bedtime. 30 tablet 0  . oxyCODONE (OXY IR/ROXICODONE) 5 MG immediate release tablet Take 10 mg by mouth 3 (three) times daily as needed for moderate pain.   0  . OXYGEN Inhale 2 L into the lungs at bedtime.    . sevelamer carbonate (RENVELA) 800 MG tablet Take 1,600-4,000 mg by mouth as directed. Take 4000 mg (5 tabs) 3 times daily with meals and 1600 mg  (2 Tabs) with snacks    . traZODone (DESYREL) 50 MG tablet TAKE ONE TABLET BY MOUTH ONCE DAILY AT BEDTIME AS NEEDED FOR SLEEP 60 tablet 0  . warfarin (COUMADIN) 5 MG tablet Take 1 tablet (5 mg total) by mouth daily at 6 PM. (Patient taking  differently: Take 5-7 mg by mouth daily at 6 PM. Take 5 mg Mon / Wed / Fri / Sun  Take 7 mg on Tues / Thurs / Sat) 30 tablet 0  . Wheat Dextrin (BENEFIBER) POWD Take 1 Dose by mouth 3 (three) times a week.    . zolpidem (AMBIEN) 5 MG tablet Take 5 mg by mouth at bedtime as needed for sleep.     Marland Kitchen tiZANidine (ZANAFLEX) 4 MG tablet Take 1 tablet (4 mg total) by mouth every 6 (six) hours as needed for muscle spasms. (Patient not taking: Reported on 04/19/2018) 30 tablet 0   No current facility-administered medications for this visit.     On ROS today: 10 system ROS is negative unless otherwise noted in HPI   Physical Examination   Vitals:   04/19/18 1549  BP: 110/70  Pulse: 88  Resp: 14  Temp: 98 F (36.7 C)  TempSrc: Oral  SpO2: 93%  Weight: (!) 303 lb (137.4 kg)  Height: 6' (1.829 m)   Body mass index is 41.09 kg/m.  General Alert, O x 3, Obese, NAD  Pulmonary Sym exp, good B air movt,   Cardiac RRR, Nl S1, S2,  Vascular Vessel Right Left  Radial Palpable Palpable  Brachial Palpable thrill and audible bruit through the loop graft and right upper arm Palpable  Ulnar Not palpable Not palpable   Faintly palpable right DP pulse; right peroneal by Doppler; unable to obtain Doppler signal at the ankle for PTA  Musculo- skeletal M/S 5/5 throughout  ,  No ulceration noted between fourth and fifth toes of right foot; lateral right fifth toe ulceration without drainage or obvious infection; darkening of the fifth toe; ulceration is dry unable to tell depth of wound due to small size  Neurologic Pain and light touch intact in extremities , Motor exam as listed above     Non-invasive Vascular Imaging   Right lower extremity arterial duplex with patent CFA, SFA, popliteal  Right peroneal artery not visualized  Distal posterior tibial artery decreased velocity which may suggest a more distal stenosis  Anterior tibial artery patent   Outside Studies/Documentation    pages of  outside documents were reviewed including: Wound care progress notes as well as x-ray results   Medical Decision Making   Christina Walters is a 47 y.o. female who presents with ulceration of right fifth toe   Dr. Randie Heinz was involved in the evaluation and management plan of this patient today  Right lower extremity arteriogram performed 09/2017 demonstrated a three-vessel runoff at least to the level of the ankle with no SFA or popliteal disease noted; duplex today consistent with arteriogram findings  With a palpable DP pulse and brisk right peroneal Doppler signal, patient  may have enough blood flow to heal right fifth toe ulceration with continued aggressive wound care  Patient is aware she may require fifth toe amputation however there is no indication at this time  Right upper arm AV loop graft working well for hemodialysis currently; patient will be catheter dependent in the event right arm AV graft shuts down; right AV thigh graft no longer an option given ulceration  Fifth toe re-dressed in office  Continue weekly wound care  Follow-up PRN  Emilie Rutter, PA-C [facility] Office: [phone]

## 2018-04-24 ENCOUNTER — Encounter: Payer: Self-pay | Admitting: Nephrology
